# Patient Record
Sex: Male | Born: 1935 | Race: White | Hispanic: No | Smoking: Former smoker
Health system: Southern US, Community
[De-identification: ages and names within clinical notes are randomized; demographics above are authoritative.]

## PROBLEM LIST (undated history)

## (undated) DIAGNOSIS — I10 Essential (primary) hypertension: Secondary | ICD-10-CM

## (undated) DIAGNOSIS — F0390 Unspecified dementia without behavioral disturbance: Secondary | ICD-10-CM

## (undated) DIAGNOSIS — G8929 Other chronic pain: Secondary | ICD-10-CM

## (undated) DIAGNOSIS — D649 Anemia, unspecified: Secondary | ICD-10-CM

## (undated) DIAGNOSIS — F32A Depression, unspecified: Secondary | ICD-10-CM

## (undated) DIAGNOSIS — K219 Gastro-esophageal reflux disease without esophagitis: Secondary | ICD-10-CM

## (undated) DIAGNOSIS — J189 Pneumonia, unspecified organism: Secondary | ICD-10-CM

## (undated) DIAGNOSIS — F419 Anxiety disorder, unspecified: Secondary | ICD-10-CM

## (undated) DIAGNOSIS — F329 Major depressive disorder, single episode, unspecified: Secondary | ICD-10-CM

## (undated) DIAGNOSIS — N289 Disorder of kidney and ureter, unspecified: Secondary | ICD-10-CM

## (undated) DIAGNOSIS — J449 Chronic obstructive pulmonary disease, unspecified: Secondary | ICD-10-CM

## (undated) HISTORY — PX: HIP ARTHROPLASTY: SHX981

## (undated) HISTORY — PX: HERNIA REPAIR: SHX51

## (undated) HISTORY — PX: LUMBAR LAMINECTOMY: SHX95

## (undated) HISTORY — PX: CATARACT EXTRACTION: SUR2

---

## 2003-12-31 ENCOUNTER — Other Ambulatory Visit: Payer: Self-pay

## 2004-05-26 ENCOUNTER — Emergency Department: Payer: Self-pay | Admitting: Emergency Medicine

## 2004-07-08 ENCOUNTER — Other Ambulatory Visit: Payer: Self-pay

## 2004-07-08 ENCOUNTER — Ambulatory Visit: Payer: Self-pay | Admitting: Unknown Physician Specialty

## 2004-09-19 ENCOUNTER — Inpatient Hospital Stay: Payer: Self-pay | Admitting: General Practice

## 2004-09-23 ENCOUNTER — Emergency Department: Payer: Self-pay | Admitting: Emergency Medicine

## 2006-08-17 ENCOUNTER — Emergency Department: Payer: Self-pay | Admitting: Emergency Medicine

## 2007-08-17 ENCOUNTER — Emergency Department: Payer: Self-pay | Admitting: Emergency Medicine

## 2007-08-17 ENCOUNTER — Other Ambulatory Visit: Payer: Self-pay

## 2007-09-18 ENCOUNTER — Ambulatory Visit: Payer: Self-pay | Admitting: Gastroenterology

## 2007-09-24 ENCOUNTER — Ambulatory Visit: Payer: Self-pay | Admitting: General Practice

## 2007-09-25 ENCOUNTER — Ambulatory Visit: Payer: Self-pay | Admitting: Gastroenterology

## 2008-04-15 ENCOUNTER — Ambulatory Visit: Payer: Self-pay | Admitting: General Practice

## 2008-04-15 ENCOUNTER — Other Ambulatory Visit: Payer: Self-pay

## 2008-09-16 ENCOUNTER — Ambulatory Visit: Payer: Self-pay | Admitting: General Practice

## 2008-09-27 ENCOUNTER — Inpatient Hospital Stay: Payer: Self-pay | Admitting: General Practice

## 2011-08-27 ENCOUNTER — Emergency Department: Payer: Self-pay | Admitting: Emergency Medicine

## 2011-12-02 ENCOUNTER — Inpatient Hospital Stay: Payer: Self-pay | Admitting: Internal Medicine

## 2011-12-02 LAB — COMPREHENSIVE METABOLIC PANEL
Alkaline Phosphatase: 71 U/L (ref 50–136)
BUN: 27 mg/dL — ABNORMAL HIGH (ref 7–18)
Bilirubin,Total: 0.1 mg/dL — ABNORMAL LOW (ref 0.2–1.0)
Potassium: 4.1 mmol/L (ref 3.5–5.1)
SGOT(AST): 27 U/L (ref 15–37)
SGPT (ALT): 18 U/L

## 2011-12-02 LAB — URINALYSIS, COMPLETE
Bacteria: NONE SEEN
Blood: NEGATIVE
Glucose,UR: NEGATIVE mg/dL (ref 0–75)
Ketone: NEGATIVE
Ph: 5 (ref 4.5–8.0)
Protein: NEGATIVE
Squamous Epithelial: <1
WBC UR: <1 /HPF (ref 0–5)

## 2011-12-02 LAB — CBC
MCH: 30.8 pg (ref 26.0–34.0)
MCHC: 31.6 g/dL — ABNORMAL LOW (ref 32.0–36.0)
Platelet: 294 10*3/uL (ref 150–440)
RBC: 3.64 10*6/uL — ABNORMAL LOW (ref 4.40–5.90)
RDW: 15.3 % — ABNORMAL HIGH (ref 11.5–14.5)

## 2011-12-02 LAB — PRO B NATRIURETIC PEPTIDE: B-Type Natriuretic Peptide: 1056 pg/mL — ABNORMAL HIGH (ref 0–450)

## 2011-12-02 LAB — MAGNESIUM: Magnesium: 1.9 mg/dL

## 2011-12-02 LAB — TSH: Thyroid Stimulating Horm: 1.97 u[IU]/mL

## 2011-12-02 LAB — TROPONIN I: Troponin-I: <0.02 ng/mL

## 2011-12-02 LAB — CK TOTAL AND CKMB (NOT AT ARMC): CK-MB: 2 ng/mL (ref 0.5–3.6)

## 2011-12-02 LAB — PHOSPHORUS: Phosphorus: 4.6 mg/dL (ref 2.5–4.9)

## 2011-12-03 LAB — COMPREHENSIVE METABOLIC PANEL
Albumin: 3.1 g/dL — ABNORMAL LOW (ref 3.4–5.0)
Alkaline Phosphatase: 60 U/L (ref 50–136)
Anion Gap: 11 (ref 7–16)
BUN: 26 mg/dL — ABNORMAL HIGH (ref 7–18)
Bilirubin,Total: 0.1 mg/dL — ABNORMAL LOW (ref 0.2–1.0)
Calcium, Total: 8.3 mg/dL — ABNORMAL LOW (ref 8.5–10.1)
Chloride: 107 mmol/L (ref 98–107)
Glucose: 116 mg/dL — ABNORMAL HIGH (ref 65–99)
Osmolality: 287 (ref 275–301)
SGOT(AST): 17 U/L (ref 15–37)
SGPT (ALT): 15 U/L
Total Protein: 6 g/dL — ABNORMAL LOW (ref 6.4–8.2)

## 2011-12-03 LAB — CBC WITH DIFFERENTIAL/PLATELET
Basophil #: 0.1 10*3/uL (ref 0.0–0.1)
Basophil %: 1.1 %
Eosinophil #: 1.6 10*3/uL — ABNORMAL HIGH (ref 0.0–0.7)
Eosinophil %: 17.5 %
HGB: 10.1 g/dL — ABNORMAL LOW (ref 13.0–18.0)
MCH: 31 pg (ref 26.0–34.0)
MCV: 97 fL (ref 80–100)
Monocyte #: 1 x10 3/mm (ref 0.2–1.0)
Monocyte %: 10.1 %
Neutrophil #: 5.6 10*3/uL (ref 1.4–6.5)
Neutrophil %: 59.4 %
RBC: 3.26 10*6/uL — ABNORMAL LOW (ref 4.40–5.90)
RDW: 15.1 % — ABNORMAL HIGH (ref 11.5–14.5)
WBC: 9.4 10*3/uL (ref 3.8–10.6)

## 2011-12-03 LAB — TROPONIN I: Troponin-I: <0.02 ng/mL

## 2011-12-03 LAB — CK TOTAL AND CKMB (NOT AT ARMC): CK, Total: 64 U/L (ref 35–232)

## 2011-12-05 LAB — BASIC METABOLIC PANEL
Anion Gap: 9 (ref 7–16)
BUN: 23 mg/dL — ABNORMAL HIGH (ref 7–18)
Co2: 26 mmol/L (ref 21–32)
Creatinine: 1.61 mg/dL — ABNORMAL HIGH (ref 0.60–1.30)
Glucose: 110 mg/dL — ABNORMAL HIGH (ref 65–99)
Potassium: 4.1 mmol/L (ref 3.5–5.1)
Sodium: 139 mmol/L (ref 136–145)

## 2011-12-16 LAB — BASIC METABOLIC PANEL
Anion Gap: 9 (ref 7–16)
BUN: 29 mg/dL — ABNORMAL HIGH (ref 7–18)
Calcium, Total: 8.2 mg/dL — ABNORMAL LOW (ref 8.5–10.1)
Chloride: 103 mmol/L (ref 98–107)
Creatinine: 2.12 mg/dL — ABNORMAL HIGH (ref 0.60–1.30)
EGFR (Non-African Amer.): 27 — ABNORMAL LOW
Glucose: 121 mg/dL — ABNORMAL HIGH (ref 65–99)
Osmolality: 279 (ref 275–301)
Sodium: 136 mmol/L (ref 136–145)

## 2011-12-16 LAB — CBC WITH DIFFERENTIAL/PLATELET
Basophil #: 0.1 10*3/uL (ref 0.0–0.1)
Eosinophil #: 0 10*3/uL (ref 0.0–0.7)
Eosinophil %: 0.3 %
HCT: 29.9 % — ABNORMAL LOW (ref 40.0–52.0)
Lymphocyte #: 0.5 10*3/uL — ABNORMAL LOW (ref 1.0–3.6)
MCH: 30.9 pg (ref 26.0–34.0)
Monocyte #: 1.4 x10 3/mm — ABNORMAL HIGH (ref 0.2–1.0)
Monocyte %: 8.8 %
Neutrophil #: 13.9 10*3/uL — ABNORMAL HIGH (ref 1.4–6.5)
Neutrophil %: 87.2 %
Platelet: 254 10*3/uL (ref 150–440)
RBC: 3.13 10*6/uL — ABNORMAL LOW (ref 4.40–5.90)

## 2011-12-17 ENCOUNTER — Inpatient Hospital Stay: Payer: Self-pay | Admitting: Internal Medicine

## 2011-12-18 LAB — COMPREHENSIVE METABOLIC PANEL
Albumin: 2.5 g/dL — ABNORMAL LOW (ref 3.4–5.0)
Alkaline Phosphatase: 58 U/L (ref 50–136)
Bilirubin,Total: 0.2 mg/dL (ref 0.2–1.0)
SGOT(AST): 16 U/L (ref 15–37)
Total Protein: 5.5 g/dL — ABNORMAL LOW (ref 6.4–8.2)

## 2011-12-18 LAB — BASIC METABOLIC PANEL
Anion Gap: 9 (ref 7–16)
BUN: 19 mg/dL — ABNORMAL HIGH (ref 7–18)
Calcium, Total: 7.7 mg/dL — ABNORMAL LOW (ref 8.5–10.1)
Chloride: 106 mmol/L (ref 98–107)
Co2: 24 mmol/L (ref 21–32)
Creatinine: 1.81 mg/dL — ABNORMAL HIGH (ref 0.60–1.30)
EGFR (African American): 38 — ABNORMAL LOW

## 2011-12-18 LAB — CBC WITH DIFFERENTIAL/PLATELET
Eosinophil #: 0.3 10*3/uL (ref 0.0–0.7)
Eosinophil %: 3.3 %
HCT: 24.7 % — ABNORMAL LOW (ref 40.0–52.0)
HGB: 8.3 g/dL — ABNORMAL LOW (ref 13.0–18.0)
Lymphocyte %: 6.7 %
MCH: 31.7 pg (ref 26.0–34.0)
MCHC: 33.5 g/dL (ref 32.0–36.0)
MCV: 95 fL (ref 80–100)
Monocyte #: 0.9 x10 3/mm (ref 0.2–1.0)
Neutrophil #: 7.7 10*3/uL — ABNORMAL HIGH (ref 1.4–6.5)
Platelet: 205 10*3/uL (ref 150–440)
RBC: 2.62 10*6/uL — ABNORMAL LOW (ref 4.40–5.90)
WBC: 9.7 10*3/uL (ref 3.8–10.6)

## 2011-12-19 LAB — CBC WITH DIFFERENTIAL/PLATELET
Basophil %: 0.6 %
Eosinophil #: 0.9 10*3/uL — ABNORMAL HIGH (ref 0.0–0.7)
HCT: 27.6 % — ABNORMAL LOW (ref 40.0–52.0)
HGB: 9.1 g/dL — ABNORMAL LOW (ref 13.0–18.0)
MCH: 31.3 pg (ref 26.0–34.0)
Neutrophil #: 7.5 10*3/uL — ABNORMAL HIGH (ref 1.4–6.5)
Neutrophil %: 74.7 %

## 2011-12-19 LAB — COMPREHENSIVE METABOLIC PANEL
Alkaline Phosphatase: 58 U/L (ref 50–136)
Anion Gap: 8 (ref 7–16)
BUN: 19 mg/dL — ABNORMAL HIGH (ref 7–18)
Bilirubin,Total: 0.3 mg/dL (ref 0.2–1.0)
Calcium, Total: 8.2 mg/dL — ABNORMAL LOW (ref 8.5–10.1)
Co2: 26 mmol/L (ref 21–32)
EGFR (Non-African Amer.): 35 — ABNORMAL LOW
Osmolality: 282 (ref 275–301)
Potassium: 4.4 mmol/L (ref 3.5–5.1)
SGOT(AST): 17 U/L (ref 15–37)
Sodium: 140 mmol/L (ref 136–145)
Total Protein: 5.9 g/dL — ABNORMAL LOW (ref 6.4–8.2)

## 2011-12-22 LAB — CULTURE, BLOOD (SINGLE)

## 2012-01-03 LAB — COMPREHENSIVE METABOLIC PANEL
Albumin: 3.7 g/dL (ref 3.4–5.0)
Alkaline Phosphatase: 77 U/L (ref 50–136)
Anion Gap: 11 (ref 7–16)
BUN: 27 mg/dL — ABNORMAL HIGH (ref 7–18)
Calcium, Total: 8.8 mg/dL (ref 8.5–10.1)
Chloride: 107 mmol/L (ref 98–107)
Co2: 25 mmol/L (ref 21–32)
EGFR (Non-African Amer.): 23 — ABNORMAL LOW
Glucose: 106 mg/dL — ABNORMAL HIGH (ref 65–99)
Osmolality: 291 (ref 275–301)
Potassium: 4.9 mmol/L (ref 3.5–5.1)
SGOT(AST): 17 U/L (ref 15–37)
SGPT (ALT): 17 U/L
Sodium: 143 mmol/L (ref 136–145)
Total Protein: 7.1 g/dL (ref 6.4–8.2)

## 2012-01-03 LAB — CBC
HGB: 9.3 g/dL — ABNORMAL LOW (ref 13.0–18.0)
MCH: 32.2 pg (ref 26.0–34.0)
MCV: 96 fL (ref 80–100)
Platelet: 308 10*3/uL (ref 150–440)
RBC: 2.9 10*6/uL — ABNORMAL LOW (ref 4.40–5.90)
WBC: 8.5 10*3/uL (ref 3.8–10.6)

## 2012-01-03 LAB — CK TOTAL AND CKMB (NOT AT ARMC)
CK, Total: 104 U/L (ref 35–232)
CK-MB: 2 ng/mL (ref 0.5–3.6)

## 2012-01-03 LAB — PRO B NATRIURETIC PEPTIDE: B-Type Natriuretic Peptide: 608 pg/mL — ABNORMAL HIGH (ref 0–450)

## 2012-01-04 ENCOUNTER — Observation Stay: Payer: Self-pay | Admitting: Internal Medicine

## 2012-01-04 LAB — MAGNESIUM: Magnesium: 2 mg/dL

## 2012-03-19 ENCOUNTER — Ambulatory Visit: Payer: Self-pay | Admitting: Internal Medicine

## 2012-11-30 ENCOUNTER — Emergency Department: Payer: Self-pay | Admitting: Emergency Medicine

## 2013-10-04 ENCOUNTER — Ambulatory Visit: Payer: Self-pay | Admitting: Internal Medicine

## 2013-10-08 LAB — CBC CANCER CENTER
Bands: 1 %
EOS PCT: 12 %
HCT: 26.2 % — AB (ref 40.0–52.0)
HGB: 8.4 g/dL — ABNORMAL LOW (ref 13.0–18.0)
LYMPHS PCT: 4 %
MCH: 33 pg (ref 26.0–34.0)
MCHC: 32.2 g/dL (ref 32.0–36.0)
MCV: 103 fL — AB (ref 80–100)
MONOS PCT: 6 %
PLATELETS: 336 x10 3/mm (ref 150–440)
RBC: 2.55 10*6/uL — AB (ref 4.40–5.90)
RDW: 15 % — ABNORMAL HIGH (ref 11.5–14.5)
SEGMENTED NEUTROPHILS: 77 %
WBC: 10.4 x10 3/mm (ref 3.8–10.6)

## 2013-10-08 LAB — FERRITIN: Ferritin (ARMC): 63 ng/mL (ref 8–388)

## 2013-10-08 LAB — LACTATE DEHYDROGENASE: LDH: 190 U/L (ref 85–241)

## 2013-10-08 LAB — RETICULOCYTES
Absolute Retic Count: 0.0945 10*6/uL (ref 0.019–0.186)
RETICULOCYTE: 3.7 % — AB (ref 0.4–3.1)

## 2013-10-08 LAB — IRON AND TIBC
IRON BIND. CAP.(TOTAL): 364 ug/dL (ref 250–450)
IRON: 22 ug/dL — AB (ref 65–175)
Iron Saturation: 6 %
Unbound Iron-Bind.Cap.: 342 ug/dL

## 2013-10-08 LAB — FOLATE: Folic Acid: 48.6 ng/mL (ref 3.1–100.0)

## 2013-10-10 ENCOUNTER — Observation Stay: Payer: Self-pay | Admitting: Internal Medicine

## 2013-10-10 LAB — COMPREHENSIVE METABOLIC PANEL
ALBUMIN: 3.4 g/dL (ref 3.4–5.0)
AST: 23 U/L (ref 15–37)
Alkaline Phosphatase: 79 U/L
Anion Gap: 6 — ABNORMAL LOW (ref 7–16)
BUN: 32 mg/dL — ABNORMAL HIGH (ref 7–18)
Bilirubin,Total: 0.2 mg/dL (ref 0.2–1.0)
CHLORIDE: 106 mmol/L (ref 98–107)
CO2: 27 mmol/L (ref 21–32)
Calcium, Total: 8.5 mg/dL (ref 8.5–10.1)
Creatinine: 2.11 mg/dL — ABNORMAL HIGH (ref 0.60–1.30)
EGFR (African American): 31 — ABNORMAL LOW
GFR CALC NON AF AMER: 27 — AB
GLUCOSE: 103 mg/dL — AB (ref 65–99)
Osmolality: 285 (ref 275–301)
Potassium: 3.7 mmol/L (ref 3.5–5.1)
SGPT (ALT): 21 U/L (ref 12–78)
Sodium: 139 mmol/L (ref 136–145)
TOTAL PROTEIN: 6.6 g/dL (ref 6.4–8.2)

## 2013-10-10 LAB — CBC WITH DIFFERENTIAL/PLATELET
BASOS ABS: 0.1 10*3/uL (ref 0.0–0.1)
Basophil %: 1.1 %
Eosinophil #: 1.3 10*3/uL — ABNORMAL HIGH (ref 0.0–0.7)
Eosinophil %: 11.2 %
HCT: 26.7 % — ABNORMAL LOW (ref 40.0–52.0)
HGB: 8.8 g/dL — ABNORMAL LOW (ref 13.0–18.0)
LYMPHS ABS: 0.6 10*3/uL — AB (ref 1.0–3.6)
Lymphocyte %: 5.2 %
MCH: 34 pg (ref 26.0–34.0)
MCHC: 33 g/dL (ref 32.0–36.0)
MCV: 103 fL — AB (ref 80–100)
MONOS PCT: 9.3 %
Monocyte #: 1.1 x10 3/mm — ABNORMAL HIGH (ref 0.2–1.0)
NEUTROS ABS: 8.5 10*3/uL — AB (ref 1.4–6.5)
NEUTROS PCT: 73.2 %
Platelet: 317 10*3/uL (ref 150–440)
RBC: 2.59 10*6/uL — AB (ref 4.40–5.90)
RDW: 15 % — ABNORMAL HIGH (ref 11.5–14.5)
WBC: 11.6 10*3/uL — AB (ref 3.8–10.6)

## 2013-10-10 LAB — PROTIME-INR
INR: 1
Prothrombin Time: 12.7 secs (ref 11.5–14.7)

## 2013-10-10 LAB — MAGNESIUM: MAGNESIUM: 2.1 mg/dL

## 2013-10-10 LAB — URINALYSIS, COMPLETE
BACTERIA: NONE SEEN
Bilirubin,UR: NEGATIVE
Blood: NEGATIVE
GLUCOSE, UR: NEGATIVE mg/dL (ref 0–75)
KETONE: NEGATIVE
Leukocyte Esterase: NEGATIVE
Nitrite: NEGATIVE
Ph: 6 (ref 4.5–8.0)
RBC, UR: NONE SEEN /HPF (ref 0–5)
SQUAMOUS EPITHELIAL: NONE SEEN
Specific Gravity: 1.009 (ref 1.003–1.030)

## 2013-10-10 LAB — APTT: Activated PTT: 31.6 secs (ref 23.6–35.9)

## 2013-10-10 LAB — LIPASE, BLOOD: Lipase: 201 U/L (ref 73–393)

## 2013-10-10 LAB — SEDIMENTATION RATE: Erythrocyte Sed Rate: 32 mm/hr — ABNORMAL HIGH (ref 0–20)

## 2013-10-10 LAB — TSH: Thyroid Stimulating Horm: 2.17 u[IU]/mL

## 2013-10-10 LAB — GAMMA GT: GGT: 23 U/L (ref 5–85)

## 2013-10-10 LAB — TROPONIN I: Troponin-I: <0.02 ng/mL

## 2013-10-11 LAB — CBC WITH DIFFERENTIAL/PLATELET
BASOS ABS: 0.1 10*3/uL (ref 0.0–0.1)
BASOS PCT: 1.3 %
Eosinophil #: 1.4 10*3/uL — ABNORMAL HIGH (ref 0.0–0.7)
Eosinophil %: 12.3 %
HCT: 29.3 % — ABNORMAL LOW (ref 40.0–52.0)
HGB: 9.5 g/dL — ABNORMAL LOW (ref 13.0–18.0)
LYMPHS ABS: 0.6 10*3/uL — AB (ref 1.0–3.6)
LYMPHS PCT: 5.6 %
MCH: 33.1 pg (ref 26.0–34.0)
MCHC: 32.2 g/dL (ref 32.0–36.0)
MCV: 103 fL — ABNORMAL HIGH (ref 80–100)
MONO ABS: 0.9 x10 3/mm (ref 0.2–1.0)
Monocyte %: 8.4 %
Neutrophil #: 8 10*3/uL — ABNORMAL HIGH (ref 1.4–6.5)
Neutrophil %: 72.4 %
PLATELETS: 327 10*3/uL (ref 150–440)
RBC: 2.86 10*6/uL — ABNORMAL LOW (ref 4.40–5.90)
RDW: 14.8 % — ABNORMAL HIGH (ref 11.5–14.5)
WBC: 11.1 10*3/uL — ABNORMAL HIGH (ref 3.8–10.6)

## 2013-10-11 LAB — BASIC METABOLIC PANEL
Anion Gap: 5 — ABNORMAL LOW (ref 7–16)
BUN: 24 mg/dL — ABNORMAL HIGH (ref 7–18)
CREATININE: 1.7 mg/dL — AB (ref 0.60–1.30)
Calcium, Total: 8.7 mg/dL (ref 8.5–10.1)
Chloride: 105 mmol/L (ref 98–107)
Co2: 29 mmol/L (ref 21–32)
EGFR (African American): 40 — ABNORMAL LOW
EGFR (Non-African Amer.): 35 — ABNORMAL LOW
Glucose: 94 mg/dL (ref 65–99)
Osmolality: 281 (ref 275–301)
Potassium: 3.8 mmol/L (ref 3.5–5.1)
Sodium: 139 mmol/L (ref 136–145)

## 2013-10-12 LAB — COMPREHENSIVE METABOLIC PANEL
ALT: 21 U/L (ref 12–78)
AST: 24 U/L (ref 15–37)
Albumin: 3 g/dL — ABNORMAL LOW (ref 3.4–5.0)
Alkaline Phosphatase: 57 U/L
Anion Gap: 6 — ABNORMAL LOW (ref 7–16)
BUN: 19 mg/dL — ABNORMAL HIGH (ref 7–18)
Bilirubin,Total: 0.2 mg/dL (ref 0.2–1.0)
CALCIUM: 8.2 mg/dL — AB (ref 8.5–10.1)
Chloride: 106 mmol/L (ref 98–107)
Co2: 30 mmol/L (ref 21–32)
Creatinine: 1.67 mg/dL — ABNORMAL HIGH (ref 0.60–1.30)
EGFR (African American): 41 — ABNORMAL LOW
EGFR (Non-African Amer.): 35 — ABNORMAL LOW
GLUCOSE: 96 mg/dL (ref 65–99)
Osmolality: 285 (ref 275–301)
Potassium: 3.6 mmol/L (ref 3.5–5.1)
SODIUM: 142 mmol/L (ref 136–145)
Total Protein: 6 g/dL — ABNORMAL LOW (ref 6.4–8.2)

## 2013-10-12 LAB — URINE CULTURE

## 2013-10-12 LAB — CBC WITH DIFFERENTIAL/PLATELET
BASOS ABS: 0.1 10*3/uL (ref 0.0–0.1)
Basophil %: 0.8 %
Eosinophil #: 1.1 10*3/uL — ABNORMAL HIGH (ref 0.0–0.7)
Eosinophil %: 9.1 %
HCT: 28.5 % — ABNORMAL LOW (ref 40.0–52.0)
HGB: 9.4 g/dL — AB (ref 13.0–18.0)
LYMPHS PCT: 5.2 %
Lymphocyte #: 0.6 10*3/uL — ABNORMAL LOW (ref 1.0–3.6)
MCH: 33.7 pg (ref 26.0–34.0)
MCHC: 33.1 g/dL (ref 32.0–36.0)
MCV: 102 fL — ABNORMAL HIGH (ref 80–100)
MONO ABS: 1.2 x10 3/mm — AB (ref 0.2–1.0)
MONOS PCT: 9.9 %
Neutrophil #: 8.9 10*3/uL — ABNORMAL HIGH (ref 1.4–6.5)
Neutrophil %: 75 %
PLATELETS: 315 10*3/uL (ref 150–440)
RBC: 2.8 10*6/uL — ABNORMAL LOW (ref 4.40–5.90)
RDW: 14.5 % (ref 11.5–14.5)
WBC: 11.8 10*3/uL — ABNORMAL HIGH (ref 3.8–10.6)

## 2013-10-15 LAB — CULTURE, BLOOD (SINGLE)

## 2013-10-17 ENCOUNTER — Ambulatory Visit: Payer: Self-pay | Admitting: Internal Medicine

## 2013-11-05 ENCOUNTER — Emergency Department: Payer: Self-pay | Admitting: Emergency Medicine

## 2013-11-05 LAB — DRUG SCREEN, URINE
Amphetamines, Ur Screen: NEGATIVE (ref ?–1000)
BARBITURATES, UR SCREEN: NEGATIVE (ref ?–200)
BENZODIAZEPINE, UR SCRN: NEGATIVE (ref ?–200)
Cannabinoid 50 Ng, Ur ~~LOC~~: NEGATIVE (ref ?–50)
Cocaine Metabolite,Ur ~~LOC~~: NEGATIVE (ref ?–300)
MDMA (ECSTASY) UR SCREEN: NEGATIVE (ref ?–500)
Methadone, Ur Screen: NEGATIVE (ref ?–300)
Opiate, Ur Screen: NEGATIVE (ref ?–300)
Phencyclidine (PCP) Ur S: NEGATIVE (ref ?–25)
Tricyclic, Ur Screen: NEGATIVE (ref ?–1000)

## 2013-11-05 LAB — CBC
HCT: 26.6 % — ABNORMAL LOW (ref 40.0–52.0)
HGB: 8.8 g/dL — AB (ref 13.0–18.0)
MCH: 30.7 pg (ref 26.0–34.0)
MCHC: 33.1 g/dL (ref 32.0–36.0)
MCV: 93 fL (ref 80–100)
PLATELETS: 353 10*3/uL (ref 150–440)
RBC: 2.87 10*6/uL — ABNORMAL LOW (ref 4.40–5.90)
RDW: 16.6 % — AB (ref 11.5–14.5)
WBC: 10.4 10*3/uL (ref 3.8–10.6)

## 2013-11-05 LAB — URINALYSIS, COMPLETE
BILIRUBIN, UR: NEGATIVE
Blood: NEGATIVE
GLUCOSE, UR: NEGATIVE mg/dL (ref 0–75)
Ketone: NEGATIVE
Leukocyte Esterase: NEGATIVE
Nitrite: NEGATIVE
PH: 6 (ref 4.5–8.0)
Protein: 30
RBC,UR: <1 /HPF (ref 0–5)
SPECIFIC GRAVITY: 1.008 (ref 1.003–1.030)
Squamous Epithelial: <1

## 2013-11-05 LAB — COMPREHENSIVE METABOLIC PANEL
ALT: 16 U/L (ref 12–78)
ANION GAP: 5 — AB (ref 7–16)
Albumin: 3.2 g/dL — ABNORMAL LOW (ref 3.4–5.0)
Alkaline Phosphatase: 92 U/L
BUN: 25 mg/dL — ABNORMAL HIGH (ref 7–18)
Bilirubin,Total: 0.1 mg/dL — ABNORMAL LOW (ref 0.2–1.0)
CHLORIDE: 105 mmol/L (ref 98–107)
CO2: 29 mmol/L (ref 21–32)
Calcium, Total: 8.1 mg/dL — ABNORMAL LOW (ref 8.5–10.1)
Creatinine: 2.34 mg/dL — ABNORMAL HIGH (ref 0.60–1.30)
EGFR (Non-African Amer.): 24 — ABNORMAL LOW
GFR CALC AF AMER: 27 — AB
Glucose: 115 mg/dL — ABNORMAL HIGH (ref 65–99)
OSMOLALITY: 283 (ref 275–301)
Potassium: 3.6 mmol/L (ref 3.5–5.1)
SGOT(AST): 19 U/L (ref 15–37)
SODIUM: 139 mmol/L (ref 136–145)
TOTAL PROTEIN: 6.6 g/dL (ref 6.4–8.2)

## 2013-11-05 LAB — ETHANOL

## 2013-11-05 LAB — TROPONIN I: Troponin-I: <0.02 ng/mL

## 2013-11-05 LAB — ACETAMINOPHEN LEVEL: Acetaminophen: <2 ug/mL

## 2013-11-05 LAB — SALICYLATE LEVEL: Salicylates, Serum: <1.7 mg/dL

## 2013-11-09 LAB — CBC
HCT: 30.8 % — ABNORMAL LOW (ref 40.0–52.0)
HGB: 9.9 g/dL — ABNORMAL LOW (ref 13.0–18.0)
MCH: 30 pg (ref 26.0–34.0)
MCHC: 32 g/dL (ref 32.0–36.0)
MCV: 94 fL (ref 80–100)
Platelet: 345 10*3/uL (ref 150–440)
RBC: 3.28 10*6/uL — ABNORMAL LOW (ref 4.40–5.90)
RDW: 17.4 % — ABNORMAL HIGH (ref 11.5–14.5)
WBC: 18.1 10*3/uL — AB (ref 3.8–10.6)

## 2013-11-09 LAB — BASIC METABOLIC PANEL
ANION GAP: 5 — AB (ref 7–16)
BUN: 31 mg/dL — ABNORMAL HIGH (ref 7–18)
Calcium, Total: 8.7 mg/dL (ref 8.5–10.1)
Chloride: 99 mmol/L (ref 98–107)
Co2: 31 mmol/L (ref 21–32)
Creatinine: 1.86 mg/dL — ABNORMAL HIGH (ref 0.60–1.30)
EGFR (African American): 36 — ABNORMAL LOW
EGFR (Non-African Amer.): 31 — ABNORMAL LOW
GLUCOSE: 128 mg/dL — AB (ref 65–99)
Osmolality: 278 (ref 275–301)
Potassium: 3.9 mmol/L (ref 3.5–5.1)
SODIUM: 135 mmol/L — AB (ref 136–145)

## 2013-11-18 ENCOUNTER — Ambulatory Visit: Payer: Self-pay | Admitting: Internal Medicine

## 2013-11-19 LAB — IRON AND TIBC
IRON BIND. CAP.(TOTAL): 339 ug/dL (ref 250–450)
Iron Saturation: 71 %
Iron: 242 ug/dL — ABNORMAL HIGH (ref 65–175)
Unbound Iron-Bind.Cap.: 97 ug/dL

## 2013-11-19 LAB — CANCER CENTER HEMOGLOBIN: HGB: 7.7 g/dL — ABNORMAL LOW (ref 13.0–18.0)

## 2013-11-19 LAB — FERRITIN: FERRITIN (ARMC): 43 ng/mL (ref 8–388)

## 2013-11-24 ENCOUNTER — Inpatient Hospital Stay: Payer: Self-pay

## 2013-11-24 LAB — PRO B NATRIURETIC PEPTIDE: B-Type Natriuretic Peptide: 1918 pg/mL — ABNORMAL HIGH (ref 0–450)

## 2013-11-24 LAB — COMPREHENSIVE METABOLIC PANEL
ALBUMIN: 2.9 g/dL — AB (ref 3.4–5.0)
Alkaline Phosphatase: 92 U/L
Anion Gap: 3 — ABNORMAL LOW (ref 7–16)
BUN: 29 mg/dL — ABNORMAL HIGH (ref 7–18)
Bilirubin,Total: 0.1 mg/dL — ABNORMAL LOW (ref 0.2–1.0)
CALCIUM: 7.8 mg/dL — AB (ref 8.5–10.1)
Chloride: 102 mmol/L (ref 98–107)
Co2: 30 mmol/L (ref 21–32)
Creatinine: 2.18 mg/dL — ABNORMAL HIGH (ref 0.60–1.30)
EGFR (African American): 30 — ABNORMAL LOW
EGFR (Non-African Amer.): 26 — ABNORMAL LOW
Glucose: 115 mg/dL — ABNORMAL HIGH (ref 65–99)
Osmolality: 277 (ref 275–301)
POTASSIUM: 4 mmol/L (ref 3.5–5.1)
SGOT(AST): 36 U/L (ref 15–37)
SGPT (ALT): 21 U/L (ref 12–78)
Sodium: 135 mmol/L — ABNORMAL LOW (ref 136–145)
TOTAL PROTEIN: 6.3 g/dL — AB (ref 6.4–8.2)

## 2013-11-24 LAB — CBC
HCT: 24.7 % — ABNORMAL LOW (ref 40.0–52.0)
HGB: 7.5 g/dL — ABNORMAL LOW (ref 13.0–18.0)
MCH: 28.9 pg (ref 26.0–34.0)
MCHC: 30.6 g/dL — ABNORMAL LOW (ref 32.0–36.0)
MCV: 95 fL (ref 80–100)
PLATELETS: 303 10*3/uL (ref 150–440)
RBC: 2.61 10*6/uL — ABNORMAL LOW (ref 4.40–5.90)
RDW: 19.5 % — ABNORMAL HIGH (ref 11.5–14.5)
WBC: 8.4 10*3/uL (ref 3.8–10.6)

## 2013-11-24 LAB — TROPONIN I

## 2013-11-26 LAB — BASIC METABOLIC PANEL
Anion Gap: 6 — ABNORMAL LOW (ref 7–16)
BUN: 30 mg/dL — ABNORMAL HIGH (ref 7–18)
CALCIUM: 8.6 mg/dL (ref 8.5–10.1)
CREATININE: 1.88 mg/dL — AB (ref 0.60–1.30)
Chloride: 102 mmol/L (ref 98–107)
Co2: 29 mmol/L (ref 21–32)
EGFR (Non-African Amer.): 31 — ABNORMAL LOW
GFR CALC AF AMER: 36 — AB
Glucose: 107 mg/dL — ABNORMAL HIGH (ref 65–99)
OSMOLALITY: 280 (ref 275–301)
Potassium: 3.8 mmol/L (ref 3.5–5.1)
Sodium: 137 mmol/L (ref 136–145)

## 2013-11-26 LAB — CBC WITH DIFFERENTIAL/PLATELET
BASOS ABS: 0 10*3/uL (ref 0.0–0.1)
BASOS PCT: 0.2 %
EOS ABS: 0 10*3/uL (ref 0.0–0.7)
Eosinophil %: 0.1 %
HCT: 26.6 % — ABNORMAL LOW (ref 40.0–52.0)
HGB: 8.4 g/dL — ABNORMAL LOW (ref 13.0–18.0)
LYMPHS ABS: 0.7 10*3/uL — AB (ref 1.0–3.6)
Lymphocyte %: 6.3 %
MCH: 29.9 pg (ref 26.0–34.0)
MCHC: 31.7 g/dL — AB (ref 32.0–36.0)
MCV: 94 fL (ref 80–100)
MONO ABS: 1.1 x10 3/mm — AB (ref 0.2–1.0)
Monocyte %: 9.5 %
Neutrophil #: 9.7 10*3/uL — ABNORMAL HIGH (ref 1.4–6.5)
Neutrophil %: 83.9 %
PLATELETS: 291 10*3/uL (ref 150–440)
RBC: 2.82 10*6/uL — ABNORMAL LOW (ref 4.40–5.90)
RDW: 19.4 % — ABNORMAL HIGH (ref 11.5–14.5)
WBC: 11.5 10*3/uL — ABNORMAL HIGH (ref 3.8–10.6)

## 2013-11-29 LAB — CULTURE, BLOOD (SINGLE)

## 2013-11-30 ENCOUNTER — Ambulatory Visit: Payer: Self-pay | Admitting: Internal Medicine

## 2013-12-13 LAB — CANCER CENTER HEMOGLOBIN: HGB: 9.5 g/dL — AB (ref 13.0–18.0)

## 2013-12-17 ENCOUNTER — Ambulatory Visit: Payer: Self-pay | Admitting: Internal Medicine

## 2013-12-20 LAB — CANCER CENTER HEMOGLOBIN: HGB: 10.2 g/dL — AB (ref 13.0–18.0)

## 2013-12-23 ENCOUNTER — Emergency Department: Payer: Self-pay | Admitting: Emergency Medicine

## 2013-12-23 LAB — CBC
HCT: 32.7 % — ABNORMAL LOW (ref 40.0–52.0)
HGB: 10.3 g/dL — AB (ref 13.0–18.0)
MCH: 30.5 pg (ref 26.0–34.0)
MCHC: 31.6 g/dL — AB (ref 32.0–36.0)
MCV: 96 fL (ref 80–100)
PLATELETS: 315 10*3/uL (ref 150–440)
RBC: 3.39 10*6/uL — AB (ref 4.40–5.90)
RDW: 19 % — ABNORMAL HIGH (ref 11.5–14.5)
WBC: 8 10*3/uL (ref 3.8–10.6)

## 2013-12-24 LAB — BASIC METABOLIC PANEL
Anion Gap: 4 — ABNORMAL LOW (ref 7–16)
BUN: 21 mg/dL — AB (ref 7–18)
CALCIUM: 8.5 mg/dL (ref 8.5–10.1)
CHLORIDE: 107 mmol/L (ref 98–107)
Co2: 29 mmol/L (ref 21–32)
Creatinine: 1.71 mg/dL — ABNORMAL HIGH (ref 0.60–1.30)
GFR CALC AF AMER: 40 — AB
GFR CALC NON AF AMER: 34 — AB
Glucose: 108 mg/dL — ABNORMAL HIGH (ref 65–99)
Osmolality: 283 (ref 275–301)
POTASSIUM: 4 mmol/L (ref 3.5–5.1)
Sodium: 140 mmol/L (ref 136–145)

## 2014-01-03 ENCOUNTER — Emergency Department: Payer: Self-pay | Admitting: Emergency Medicine

## 2014-01-03 LAB — CANCER CENTER HEMOGLOBIN: HGB: 11.3 g/dL — AB (ref 13.0–18.0)

## 2014-01-03 LAB — COMPREHENSIVE METABOLIC PANEL
ALBUMIN: 3.5 g/dL (ref 3.4–5.0)
Alkaline Phosphatase: 85 U/L
Anion Gap: 5 — ABNORMAL LOW (ref 7–16)
BUN: 24 mg/dL — ABNORMAL HIGH (ref 7–18)
Bilirubin,Total: 0.3 mg/dL (ref 0.2–1.0)
Calcium, Total: 9.1 mg/dL (ref 8.5–10.1)
Chloride: 103 mmol/L (ref 98–107)
Co2: 29 mmol/L (ref 21–32)
Creatinine: 1.68 mg/dL — ABNORMAL HIGH (ref 0.60–1.30)
EGFR (Non-African Amer.): 35 — ABNORMAL LOW
GFR CALC AF AMER: 41 — AB
Glucose: 106 mg/dL — ABNORMAL HIGH (ref 65–99)
Osmolality: 278 (ref 275–301)
Potassium: 3.8 mmol/L (ref 3.5–5.1)
SGOT(AST): 23 U/L (ref 15–37)
SGPT (ALT): 16 U/L (ref 12–78)
Sodium: 137 mmol/L (ref 136–145)
Total Protein: 6.9 g/dL (ref 6.4–8.2)

## 2014-01-03 LAB — CBC
HCT: 36.6 % — AB (ref 40.0–52.0)
HGB: 11.8 g/dL — AB (ref 13.0–18.0)
MCH: 30.4 pg (ref 26.0–34.0)
MCHC: 32.1 g/dL (ref 32.0–36.0)
MCV: 95 fL (ref 80–100)
PLATELETS: 285 10*3/uL (ref 150–440)
RBC: 3.86 10*6/uL — ABNORMAL LOW (ref 4.40–5.90)
RDW: 17.9 % — AB (ref 11.5–14.5)
WBC: 10.7 10*3/uL — ABNORMAL HIGH (ref 3.8–10.6)

## 2014-01-03 LAB — TROPONIN I: Troponin-I: <0.02 ng/mL

## 2014-01-03 LAB — CK: CK, TOTAL: 427 U/L — AB

## 2014-01-07 ENCOUNTER — Emergency Department: Payer: Self-pay | Admitting: Emergency Medicine

## 2014-01-07 LAB — URINALYSIS, COMPLETE
Bacteria: NONE SEEN
Bilirubin,UR: NEGATIVE
Blood: NEGATIVE
Glucose,UR: NEGATIVE mg/dL (ref 0–75)
Granular Cast: 7
Hyaline Cast: 2
KETONE: NEGATIVE
Leukocyte Esterase: NEGATIVE
NITRITE: NEGATIVE
Ph: 5 (ref 4.5–8.0)
Protein: 100
Specific Gravity: 1.012 (ref 1.003–1.030)
Squamous Epithelial: NONE SEEN
WBC UR: 1 /HPF (ref 0–5)

## 2014-01-09 ENCOUNTER — Emergency Department: Payer: Self-pay | Admitting: Emergency Medicine

## 2014-01-09 LAB — COMPREHENSIVE METABOLIC PANEL
ALBUMIN: 3.3 g/dL — AB (ref 3.4–5.0)
ANION GAP: 9 (ref 7–16)
AST: 15 U/L (ref 15–37)
Alkaline Phosphatase: 79 U/L
BILIRUBIN TOTAL: 0.3 mg/dL (ref 0.2–1.0)
BUN: 20 mg/dL — ABNORMAL HIGH (ref 7–18)
CREATININE: 1.37 mg/dL — AB (ref 0.60–1.30)
Calcium, Total: 8.9 mg/dL (ref 8.5–10.1)
Chloride: 101 mmol/L (ref 98–107)
Co2: 27 mmol/L (ref 21–32)
EGFR (African American): 52 — ABNORMAL LOW
EGFR (Non-African Amer.): 45 — ABNORMAL LOW
GLUCOSE: 121 mg/dL — AB (ref 65–99)
OSMOLALITY: 278 (ref 275–301)
POTASSIUM: 4.1 mmol/L (ref 3.5–5.1)
SGPT (ALT): 14 U/L (ref 12–78)
SODIUM: 137 mmol/L (ref 136–145)
TOTAL PROTEIN: 6.9 g/dL (ref 6.4–8.2)

## 2014-01-09 LAB — CBC
HCT: 36.2 % — ABNORMAL LOW (ref 40.0–52.0)
HGB: 11.6 g/dL — AB (ref 13.0–18.0)
MCH: 30.2 pg (ref 26.0–34.0)
MCHC: 32 g/dL (ref 32.0–36.0)
MCV: 94 fL (ref 80–100)
PLATELETS: 300 10*3/uL (ref 150–440)
RBC: 3.83 10*6/uL — AB (ref 4.40–5.90)
RDW: 17.2 % — AB (ref 11.5–14.5)
WBC: 13.2 10*3/uL — AB (ref 3.8–10.6)

## 2014-01-10 ENCOUNTER — Inpatient Hospital Stay: Payer: Self-pay | Admitting: Internal Medicine

## 2014-01-10 LAB — URINALYSIS, COMPLETE
BLOOD: NEGATIVE
Bacteria: NONE SEEN
Bilirubin,UR: NEGATIVE
GLUCOSE, UR: NEGATIVE mg/dL (ref 0–75)
Ketone: NEGATIVE
LEUKOCYTE ESTERASE: NEGATIVE
NITRITE: NEGATIVE
Ph: 5 (ref 4.5–8.0)
RBC,UR: NONE SEEN /HPF (ref 0–5)
Specific Gravity: 1.017 (ref 1.003–1.030)
Squamous Epithelial: <1

## 2014-01-10 LAB — CBC
HCT: 34.3 % — ABNORMAL LOW (ref 40.0–52.0)
HGB: 11.1 g/dL — AB (ref 13.0–18.0)
MCH: 30.2 pg (ref 26.0–34.0)
MCHC: 32.3 g/dL (ref 32.0–36.0)
MCV: 94 fL (ref 80–100)
Platelet: 342 10*3/uL (ref 150–440)
RBC: 3.67 10*6/uL — ABNORMAL LOW (ref 4.40–5.90)
RDW: 17.2 % — AB (ref 11.5–14.5)
WBC: 16.1 10*3/uL — ABNORMAL HIGH (ref 3.8–10.6)

## 2014-01-10 LAB — COMPREHENSIVE METABOLIC PANEL
Albumin: 3 g/dL — ABNORMAL LOW (ref 3.4–5.0)
Alkaline Phosphatase: 72 U/L
Anion Gap: 8 (ref 7–16)
BUN: 24 mg/dL — AB (ref 7–18)
Bilirubin,Total: 0.3 mg/dL (ref 0.2–1.0)
CALCIUM: 8.7 mg/dL (ref 8.5–10.1)
CHLORIDE: 100 mmol/L (ref 98–107)
Co2: 26 mmol/L (ref 21–32)
Creatinine: 1.85 mg/dL — ABNORMAL HIGH (ref 0.60–1.30)
EGFR (African American): 36 — ABNORMAL LOW
EGFR (Non-African Amer.): 31 — ABNORMAL LOW
Glucose: 128 mg/dL — ABNORMAL HIGH (ref 65–99)
Osmolality: 274 (ref 275–301)
Potassium: 4.3 mmol/L (ref 3.5–5.1)
SGOT(AST): 18 U/L (ref 15–37)
SGPT (ALT): 13 U/L (ref 12–78)
Sodium: 134 mmol/L — ABNORMAL LOW (ref 136–145)
TOTAL PROTEIN: 6.9 g/dL (ref 6.4–8.2)

## 2014-01-10 LAB — TROPONIN I

## 2014-01-11 LAB — BASIC METABOLIC PANEL
Anion Gap: 6 — ABNORMAL LOW (ref 7–16)
BUN: 22 mg/dL — ABNORMAL HIGH (ref 7–18)
CO2: 26 mmol/L (ref 21–32)
Calcium, Total: 8.4 mg/dL — ABNORMAL LOW (ref 8.5–10.1)
Chloride: 105 mmol/L (ref 98–107)
Creatinine: 1.61 mg/dL — ABNORMAL HIGH (ref 0.60–1.30)
EGFR (African American): 43 — ABNORMAL LOW
EGFR (Non-African Amer.): 37 — ABNORMAL LOW
GLUCOSE: 95 mg/dL (ref 65–99)
Osmolality: 277 (ref 275–301)
Potassium: 3.8 mmol/L (ref 3.5–5.1)
Sodium: 137 mmol/L (ref 136–145)

## 2014-01-11 LAB — CBC WITH DIFFERENTIAL/PLATELET
BASOS ABS: 0.1 10*3/uL (ref 0.0–0.1)
Basophil %: 0.8 %
EOS ABS: 0.1 10*3/uL (ref 0.0–0.7)
EOS PCT: 1 %
HCT: 30.7 % — AB (ref 40.0–52.0)
HGB: 10.1 g/dL — ABNORMAL LOW (ref 13.0–18.0)
Lymphocyte #: 0.8 10*3/uL — ABNORMAL LOW (ref 1.0–3.6)
Lymphocyte %: 6.9 %
MCH: 30.7 pg (ref 26.0–34.0)
MCHC: 32.8 g/dL (ref 32.0–36.0)
MCV: 94 fL (ref 80–100)
MONOS PCT: 14.3 %
Monocyte #: 1.6 x10 3/mm — ABNORMAL HIGH (ref 0.2–1.0)
NEUTROS ABS: 8.4 10*3/uL — AB (ref 1.4–6.5)
NEUTROS PCT: 77 %
Platelet: 295 10*3/uL (ref 150–440)
RBC: 3.28 10*6/uL — ABNORMAL LOW (ref 4.40–5.90)
RDW: 17.2 % — ABNORMAL HIGH (ref 11.5–14.5)
WBC: 10.9 10*3/uL — ABNORMAL HIGH (ref 3.8–10.6)

## 2014-01-13 LAB — BASIC METABOLIC PANEL
ANION GAP: 6 — AB (ref 7–16)
BUN: 17 mg/dL (ref 7–18)
CREATININE: 1.44 mg/dL — AB (ref 0.60–1.30)
Calcium, Total: 8.5 mg/dL (ref 8.5–10.1)
Chloride: 102 mmol/L (ref 98–107)
Co2: 27 mmol/L (ref 21–32)
EGFR (African American): 49 — ABNORMAL LOW
EGFR (Non-African Amer.): 42 — ABNORMAL LOW
Glucose: 92 mg/dL (ref 65–99)
Osmolality: 271 (ref 275–301)
Potassium: 3.5 mmol/L (ref 3.5–5.1)
Sodium: 135 mmol/L — ABNORMAL LOW (ref 136–145)

## 2014-01-13 LAB — CBC WITH DIFFERENTIAL/PLATELET
Basophil #: 0.1 10*3/uL (ref 0.0–0.1)
Basophil %: 0.7 %
EOS PCT: 5.4 %
Eosinophil #: 0.6 10*3/uL (ref 0.0–0.7)
HCT: 31.9 % — AB (ref 40.0–52.0)
HGB: 10.2 g/dL — ABNORMAL LOW (ref 13.0–18.0)
Lymphocyte #: 0.5 10*3/uL — ABNORMAL LOW (ref 1.0–3.6)
Lymphocyte %: 4.8 %
MCH: 29.8 pg (ref 26.0–34.0)
MCHC: 32 g/dL (ref 32.0–36.0)
MCV: 93 fL (ref 80–100)
MONO ABS: 1.5 x10 3/mm — AB (ref 0.2–1.0)
MONOS PCT: 13.1 %
Neutrophil #: 8.5 10*3/uL — ABNORMAL HIGH (ref 1.4–6.5)
Neutrophil %: 76 %
Platelet: 352 10*3/uL (ref 150–440)
RBC: 3.43 10*6/uL — ABNORMAL LOW (ref 4.40–5.90)
RDW: 16.8 % — ABNORMAL HIGH (ref 11.5–14.5)
WBC: 11.2 10*3/uL — ABNORMAL HIGH (ref 3.8–10.6)

## 2014-01-14 LAB — CULTURE, BLOOD (SINGLE)

## 2014-01-17 ENCOUNTER — Ambulatory Visit: Payer: Self-pay | Admitting: Internal Medicine

## 2014-05-13 ENCOUNTER — Emergency Department: Payer: Self-pay | Admitting: Emergency Medicine

## 2014-05-13 LAB — CK TOTAL AND CKMB (NOT AT ARMC)
CK, Total: 55 U/L
CK-MB: 1.1 ng/mL (ref 0.5–3.6)

## 2014-05-13 LAB — URINALYSIS, COMPLETE
BACTERIA: NONE SEEN
BILIRUBIN, UR: NEGATIVE
Blood: NEGATIVE
GLUCOSE, UR: NEGATIVE mg/dL (ref 0–75)
Ketone: NEGATIVE
LEUKOCYTE ESTERASE: NEGATIVE
Nitrite: NEGATIVE
PH: 7 (ref 4.5–8.0)
Protein: 30
SQUAMOUS EPITHELIAL: NONE SEEN
Specific Gravity: 1.005 (ref 1.003–1.030)
WBC UR: <1 /HPF (ref 0–5)

## 2014-05-13 LAB — CBC
HCT: 35.4 % — AB (ref 40.0–52.0)
HGB: 11.3 g/dL — ABNORMAL LOW (ref 13.0–18.0)
MCH: 30.1 pg (ref 26.0–34.0)
MCHC: 31.8 g/dL — ABNORMAL LOW (ref 32.0–36.0)
MCV: 95 fL (ref 80–100)
PLATELETS: 334 10*3/uL (ref 150–440)
RBC: 3.75 10*6/uL — AB (ref 4.40–5.90)
RDW: 16.7 % — ABNORMAL HIGH (ref 11.5–14.5)
WBC: 11.2 10*3/uL — ABNORMAL HIGH (ref 3.8–10.6)

## 2014-05-13 LAB — BASIC METABOLIC PANEL
Anion Gap: 4 — ABNORMAL LOW (ref 7–16)
BUN: 29 mg/dL — AB (ref 7–18)
CHLORIDE: 102 mmol/L (ref 98–107)
Calcium, Total: 8.2 mg/dL — ABNORMAL LOW (ref 8.5–10.1)
Co2: 30 mmol/L (ref 21–32)
Creatinine: 2.19 mg/dL — ABNORMAL HIGH (ref 0.60–1.30)
EGFR (African American): 37 — ABNORMAL LOW
EGFR (Non-African Amer.): 30 — ABNORMAL LOW
Glucose: 105 mg/dL — ABNORMAL HIGH (ref 65–99)
Osmolality: 278 (ref 275–301)
Potassium: 4.3 mmol/L (ref 3.5–5.1)
Sodium: 136 mmol/L (ref 136–145)

## 2014-05-13 LAB — TROPONIN I: Troponin-I: <0.02 ng/mL

## 2014-09-10 IMAGING — CR DG CHEST 1V PORT
1 series · 1 of 1 positions shown · non-contrast
Comparison: Portable exam 2265 hr compared to 10/10/2013

CLINICAL DATA: Weakness, confusion

EXAM:
PORTABLE CHEST - 1 VIEW

[ap]
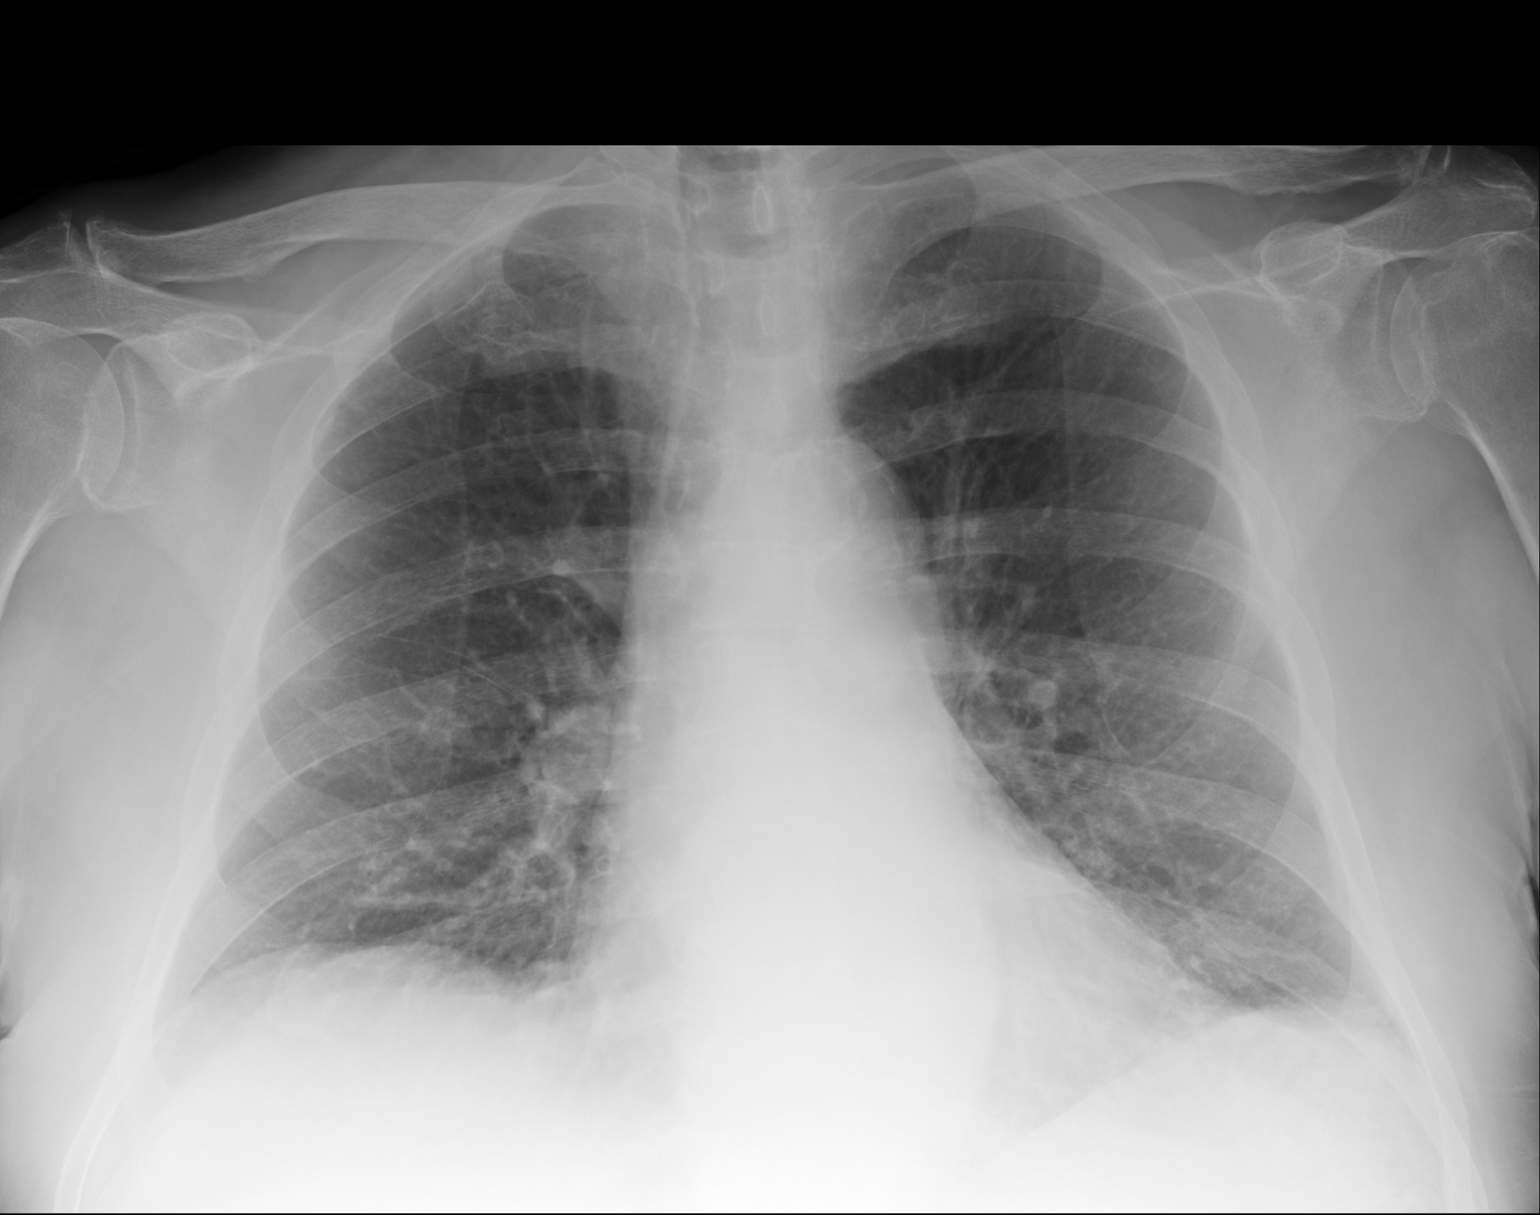

[1 of 1 positions shown; findings below may reference images not displayed]

FINDINGS: Normal heart size and pulmonary vascularity.

Calcified mildly tortuous thoracic aorta.

Peribronchial thickening with minimal bibasilar atelectasis.

Lungs otherwise clear.

No pleural effusion or pneumothorax.

Slight osseous demineralization.
IMPRESSION: Bronchitic changes with minimal bibasilar atelectasis.

## 2014-12-10 NOTE — Consult Note (Signed)
PATIENT NAME:  Terry Johnson, Terry Johnson MR#:  [id number] DATE OF BIRTH:  08-01-1921  DATE OF CONSULTATION:  10/11/2013  REFERRING PHYSICIAN:  Rusty Aus, MD CONSULTING PHYSICIAN:  Theodore Demark, NP  GI consult was ordered by Dr. Emily Filbert for evaluation of anemia.   Appreciate consult for 81 year old Caucasian man admitted with anemia, weakness, itching, suicidal ideation, for evaluation of anemia. Please reference H and P and psych consult for evaluation of non-GI complaints. Both the patient and his family deny abdominal pain, melena, hematochezia, NVD, problem swallowing, weight loss, and all other GI complaints. They state he eats well. Currently his only NSAID seems to be ASA 81 mg p.o. daily. Colonoscopy last 2009 or 2007 at the [place]. There is a history of prior colon polyps. Had EGD 2009 last with esophagitis with glycogenic acanthosis, gastritis, fundic gland polyp, and duodenitis with glandular atypia but would not exclude dysplasia. Did undergo EUS afterwards at [facility], which did not find anything significant in the duodenum, pancreas, and common bile duct. Of note, he also has stage III kidney disease and recent evaluation with Dr. Ma Hillock in the hematology clinic Friday, although I do not see a dictated note presently. Hemoglobin has also been improving over the last few weeks, was 7.8 on 02/12 and is now 9.5 on current therapy of PPI, iron, folate, and B12 according to clinic and hospital notes. Has had negative Coombs test, serum electrophoresis without M spike. Did have echocardiogram in 12/14 with LVEF of 50% to 55%. Does state he is feeling better today than yesterday.   PAST MEDICAL HISTORY: Hypertension, hyperlipidemia, BPH, GERD, V. tach on amiodarone, CKD stage III, degenerative disk disease with multiple back surgeries, bilateral hip replacement, status post hernia repair, post bilateral cataract repair.   ALLERGIES: CODEINE, MORPHINE, ACE INHIBITORS.   MEDICATIONS: Iron daily, ASA  81 mg p.o. daily, Effexor XR 75 daily, simvastatin 40 mg p.o. daily, folate 1 mg p.o. daily, vitamin B12 once daily, PreserVision once daily, pantoprazole 40 mg p.o. daily, Aricept 10 mg p.o. at bedtime, amiodarone 400 mg p.o. daily, Neurontin 100 mg p.o. at bedtime, HCTZ 25 mg p.o. daily, biotin 1 p.o. daily, terbinafine 250 mg p.o. daily.   SOCIAL HISTORY: Transitioning from living independently to living at [facility] in assisted care. No EtOH, tobacco. He is a World War II veteran.   FAMILY HISTORY: No significant family diseases reported.  REVIEW OF SYSTEMS: Ten systems reviewed. Significant for generalized pruritus as noted above and hearing loss (he does have a history of wearing bilateral hearing aids), and some mild dementia symptoms. Otherwise, systems review is negative.   LABORATORY AND RADIOLOGICAL DATA: Most recent labs: Glucose 94, BUN 24, creatinine 1.7, sodium 139, potassium 3.8, chloride 105, GFR 35, calcium 8.7, magnesium 2.1, LDH 190, lipase 201. Thyroid and hepatic panels are normal. WBC 11.1, hemoglobin 9.5, hematocrit 29.3, platelet count 327, MCV 103. Coombs test negative. Folate level 48.6. TIBC 364, iron saturation 6, serum iron 22, ferritin 63. ESR 32.   Did have a CT scan that demonstrates a stable liver lesion dating back to 2009, thought to be a hemangioma. Some colonic diverticula without signs of diverticulitis. COPD and some liver and renal cysts that are very stable, and some arthritic changes, otherwise pretty unremarkable.   PHYSICAL EXAMINATION: VITAL SIGNS: Most recent: Temperature 98.1, pulse 78, respiratory rate 18, blood pressure 170/74, SaO2 of 96%.  GENERAL: Pleasant elderly man, minimally confused, in no acute distress.  HEENT: Normocephalic, atraumatic. Sclerae  clear, conjunctivae pink.  NECK: Supple. No JVD or thyromegaly.  CHEST: Respirations eupneic. Lungs clear.  CARDIAC: S1, S2, RRR. No MRG.  ABDOMEN: Flat, soft bowel sounds, nondistended,  nontender, rather soft. No guarding, rigidity, peritoneal signs, hepatosplenomegaly, bruits or other abnormalities.  SKIN: Warm, dry, pink. Do note scattered ecchymotic/purpuric lesions to the arms.  EXTREMITIES: No clubbing. No cyanosis. No edema. Gait steady with walker. Able to stand without assist. NEUROLOGIC: Alert, oriented x 3. Some hearing loss, aided by hearing aids. Cranial nerves II through XII otherwise intact. Speech clear. No facial droop   IMPRESSION AND PLAN: Iron deficiency anemia with heme-positive stool. I think that his anemia is likely multifactorial and has been improving; however, he does seem to be losing some blood through the GI tract. Discussed indications, risks, benefits for both upper and lower endoscopy with the patient and family in regards to age and health. They would like to discuss this and will let us know how they would like to proceed.   Thank you very much for this consult.   These services were provided by Stephens November, MSN, [facility], in collaboration with Lollie Sails, MD, with whom I have discussed this patient in full.   ____________________________ Theodore Demark, NP chl:jcm D: 10/11/2013 18:22:03 ET T: 10/11/2013 18:46:04 ET JOB#: [id number]  cc: Theodore Demark, NP, <Dictator> [place] SIGNED 10/20/2013 8:19

## 2014-12-10 NOTE — Discharge Summary (Signed)
PATIENT NAME:  Terry Johnson, Terry Johnson MR#:  [id number] DATE OF BIRTH:  November 30, 1920  DATE OF ADMISSION:  10/10/2013 DATE OF DISCHARGE:  10/12/2013  DISCHARGE DIAGNOSES: 1.  Symptomatic anemia.  2.  Severe itching with depression.  3.  Acute on chronic renal failure, chronic kidney disease III.  4.  History of Ventricular tachycardia. 5.  Hypertension.  6.  Hyperlipidemia.   DISCHARGE MEDICATIONS: Folic acid 1 mg daily, venlafaxine 75 mg q. a.m., vitamin B12 1000 mcg daily, amiodarone 400 mg q. a.m., PreserVision vitamin b.i.d., pantoprazole 40 mg b.i.d., Tylenol 650 mg q. 4 p.r.n., hydroxyzine 10 mg q. 8 p.r.n. itching, amlodipine 10 mg daily, doxepin 10 mg at bedtime for itching.   REASON FOR ADMISSION: A 81 year old male who presents with severe itching and symptomatic anemia. Please see H and P for HPI, past medical history, and physical exam.   HOSPITAL COURSE: The patient was admitted. Hemoglobin was 8.4. Initially was screened for suicidal thoughts, but this was thought due to the itching. Once the itching was controlled with hydroxyzine and doxepin, those thoughts have resolved. He currently has no suicidal thoughts. There was discussion as to what to do with his anemia. His retic count was elevated. He is followed by hematology. Iron really has not made much of a difference. He was transfused with 1 unit PRBC with his hemoglobin going from 8.4 to 9.5 and was stable. He is slightly heme positive. Due to comorbid conditions, GI deferred on endoscopy and will go with b.i.d. pantoprazole with followup with hematology. He will stop his iron as there has not been helpful in the past and could be contributing to the itching. His biotin, 81 mg aspirin, Aricept, and HCTZ will be stopped for now. Blood pressure has been controlled with these medications. We will watch for lower extremity edema. [facility]. Overall prognosis is guarded.   ____________________________ Danella PentonMark F. Miller, MD mfm:sb D: 10/12/2013 07:30:20 ET T: 10/12/2013 09:24:17 ET JOB#: [id number]  cc: Danella PentonMark F. Miller, MD, <Dictator> Danella PentonMARK F MILLER MD ELECTRONICALLY SIGNED 10/13/2013 8:08

## 2014-12-10 NOTE — H&P (Signed)
PATIENT NAME:  Terry Johnson, Terry Johnson MR#:  [id number] DATE OF BIRTH:  02-21-1921  DATE OF ADMISSION:  01/10/2014  PRIMARY CARE PHYSICIAN:  Dr. Bethann PunchesMark Miller, [facility].  CHIEF COMPLAINT:  Decreased mentation, not behaving his usual self. The patient has been falling. He is not able to walk as usual.   HISTORY OF PRESENT ILLNESS:  Terry Johnson is a 81 year old Caucasian gentleman with past medical history of MI, hiatal hernia, GERD, and history of chronic back pain, comes in from home, brought in by family members after he has been experiencing multiple falls in the last several days. The patient was seen on May 22nd after he had fallen and seen in the Emergency Room where he had CT of the head, which was essentially negative for any trauma. CT cervical spine was negative for trauma as well. He was complaining of pain in his right hand. Right hand x-ray on May 22nd was essentially negative. The patient was discharged from the Emergency Room. He came back on the 24th complaining of swelling of his right wrist where he had fallen, and it was found he had a triquetral fracture of indeterminate age, along with some minimal swelling. Orthopedic consultation was obtained by ER physician at that time, and the patient was given a soft cast and sent home. Blood cultures were drawn. The patient's creatinine was 1.3 at that time. He was discharged on p.o. antibiotics, which was Keflex. The patient comes back again today given decreased mentation, lethargy, unable to function at home with falls, and is being admitted with acute-on-chronic renal failures, possible right hand cellulitis with elevated white count. Chest x-ray is negative for any pneumonia. The patient received a dose of IV vancomycin in the Emergency Room. He also is getting IV fluids.   PAST MEDICAL HISTORY:  As listed:  1.  GERD.  2.  History of arrhythmia.    3.  Arthritis.  4.  Chronic anemia.  5.  Chronic back pain.  6.  CKD stage 3.  7.   Seizures, questionable.  8.  Hypertension.  9.  History of coronary artery disease.  10.  Prostate surgery.  11.  Cataract surgery.  12.  Left hip total replacement.  13.  Hernia repair.  14.  Lower back surgery x4.  15.  History of MI.   HOME MEDICATIONS:  1.  Vitamin B12 at 1000 mcg p.o. daily.  2.  Venlafaxine 75 mg p.o. daily.  3.  Tramadol 50 mg q.4 hourly.  4.  PreserVision 1 capsule b.i.d.  5.  Protonix 40 mg daily.  6.  Norvasc 10 mg daily at bedtime.  7.  Keflex 500 mg b.i.d. This was started on May 24th in the Emergency Room.  8.  Hydroxyzine hydrochloride 10 mg 1 tablet once a day at bedtime.  9.  Gabapentin 100 mg daily in morning.  10.  Foley acid 1 mg p.o. daily.  11.  Ferrous sulfate 325 mg p.o. daily.  12.  Bupropion 75 mg daily.  13.  Biotin 1000 mcg p.o. daily.  14.  Aspirin 81 mg daily.  15.  Amiodarone 200 mg 2 tablets once a day in the morning.   ALLERGIES:  CODEINE and MORPHINE.   SOCIAL HISTORY:  Lives at home. His caregivers, who help him out during the daytime. A remote history of tobacco abuse. Denies alcohol or drug use.   FAMILY HISTORY:  Denies if any known cardiovascular illnesses.   REVIEW OF SYSTEMS:  CONSTITUTIONAL:  No  fever or fatigue. Positive for weakness.  EYES:  No blurred or double vision. Positive for cataract surgery.  ENT:  No tinnitus, ear pain, hearing loss.  RESPIRATORY:  No cough, wheeze, hemoptysis, shortness of breath, or COPD.  CARDIOVASCULAR:  No chest pain, orthopnea, edema, or dyspnea on exertion.  GASTROINTESTINAL:  No nausea, vomiting, diarrhea.  NEUROLOGIC:  No CVA, TIA. Moves all extremities well, although has weakness. No dysarthria. No vertigo or tremors.  PSYCHIATRIC:  No anxiety or depression. All other systems reviewed are negative.  SKIN:  No acne or rash.   PHYSICAL EXAMINATION:  GENERAL:  The patient is awake. He is alert. He is somewhat confused, pleasantly confused.  VITAL SIGNS:  Afebrile. Pulse is 80,  blood pressure is 170/60, sats are 96% on room air.  HEENT:  Atraumatic, normocephalic. PERRLA. EOM intact. Oral mucosa is dry.  NECK:  Supple. No JVD. No carotid bruit.  RESPIRATORY:  Clear to auscultation bilaterally. No rales, rhonchi, respiratory distress, or labored breathing.  CARDIOVASCULAR:  Both heart sounds are normal. Rate, rhythm regular. PMI not lateralized. Chest nontender.  EXTREMITIES:  Good pedal pulses, good femoral pulses, 1+ pitting edema. Right hand; the patient has a soft cast present. He does have some swelling in his fingers as noted under the cast. He is able to move his fingers well. I do not feel any evidence of compartment syndrome. ABDOMEN:  Soft, benign, nontender. No organomegaly. Positive bowel sounds.  SKIN:  Warm and dry.  PSYCHIATRIC:  The patient is pleasantly confused. Otherwise he is alert and oriented x2.   LABORATORY DATA:  White count is 16.1, H and H is 11.1 and 34.3, platelet count is 342. Troponin is 0.02. Glucose is 128, BUN is 24, creatinine 1.85, sodium is 134. LFTs within normal limits. UA negative for UTI.   CHEST X-RAY:  No acute cardiopulmonary abnormality.   ASSESSMENT:  A 81 year old Terry Johnson with history of coronary artery disease, history of myocardial infarction, gastroesophageal reflux disease, chronic back pain, and frequent falls at home, comes in with:  1.  Acute altered mental status/acute encephalopathy suspected due to dehydration with acute-on-chronic renal failure. The patient's baseline creatinines are 1.3. He clinically appears dehydrated. Creatinine is up to 1.85. We will give IV fluids; monitor ins and outs, metabolic panel; and avoid nephrotoxins.  2.  Right wrist suspected cellulitis versus swelling/contusion secondary to fall and fracture. The patient's right hand is in a soft cast. I will cover him with IV Ancef empirically for cellulitis with elevated leukocytosis, have orthopedics see the patient in consultation given his  fracture in the right hand. We will follow up blood cultures. Blood cultures from May 24th have been essentially negative. We will watch for fever curve.  3.  Frequent falls and unsteady gait. Physical therapy to evaluate when appropriate.  4.  Chronic back pain. Continue tramadol 50 mg every 4 hours as needed.   5.  History of ventricular arrhythmias on amiodarone, which we will continue.  6.  Hypertension. The patient's blood pressure is stable. We will continue amlodipine 10 mg.  7.  Gastroesophageal reflux disease. Continue Protonix.  8.  Further workup according to the patient's clinical course. Hospital admission plan was discussed with the patient and the patient's family members.   TIME SPENT:  50 minutes.     ____________________________ Wylie Hail Allena Katz, MD sap:ms D: 01/10/2014 20:50:22 ET T: 01/10/2014 21:21:30 ET JOB#: [id number]  cc: Kerrilynn Derenzo A. Allena Katz, MD, <Dictator> Danella Penton, MD Saleen Peden  A Rydan Gulyas MD ELECTRONICALLY SIGNED 01/31/2014 14:13

## 2014-12-10 NOTE — Consult Note (Signed)
Brief Consult Note: Diagnosis: Mood Disorder, Dementia.   Patient was seen by consultant.   Orders entered.   Discussed with Attending MD.   Comments: Pt seen in ED Hallway. he is a 81 [place] His son is POA. He stated that he was brought in as he tripped and fell on some chairs and used cuss words as he was upset. However, he denied having any Si/Hi or plans. Pt stated that he follows with Dr Hyacinth MeekerMiller. He remembered their ages. He stated that he is taking the medications as prescribed. No perceptual disturbances noted.   Plan; His son Leotis ShamesJeffery was [facility] and he agreed that his father should not be sent to hospital at this time.  I will release him from IVC as he is not suicidal or homicidal.  He will follow up with Dr Hyacinth MeekerMiller- PCP after d/c from hospital, when he is medically stable.  No change in psychiatric medications.   Thank you for allowing me to participate in care of this pt.  Electronic Signatures: Rhunette CroftFaheem, Emelia Sandoval S (MD)  (Signed 24-Mar-15 12:44)  Authored: Brief Consult Note   Last Updated: 24-Mar-15 12:44 by Rhunette CroftFaheem, Vance Belcourt S (MD)

## 2014-12-10 NOTE — Consult Note (Signed)
Chief Complaint:  Subjective/Chief Complaint Patient seen and examined, please see full GI consult and brief consult note. Patietn presenting with mild anemia and heme positive stool.  Patient denies n/v abdominal pain, or black stools.  Stools currently brown heme positive.  I discussed case with one of the patients sons and patient.  Will approach for now conservatively with H. pylori testing, bid pppi as you are.  Consider UGIS before considering sedated luminal evaluation in the setting of patient with significant cardiac disorder.  Will discuss further with Dr Hyacinth MeekerMiller in the am.   VITAL SIGNS/ANCILLARY NOTES: **Vital Signs.:   23-Feb-15 05:20  Diastolic BP (mmHg) Diastolic BP (mmHg) 75   Electronic Signatures: Barnetta ChapelSkulskie, Martin (MD)  (Signed 23-Feb-15 18:34)  Authored: Chief Complaint, VITAL SIGNS/ANCILLARY NOTES   Last Updated: 23-Feb-15 18:34 by Barnetta ChapelSkulskie, Martin (MD)

## 2014-12-10 NOTE — H&P (Signed)
PATIENT NAME:  Terry Johnson, Terry Johnson MR#:  [id number] DATE OF BIRTH:  [birth year]  DATE OF ADMISSION:  11/24/2013  REFERRING PHYSICIAN:  Dr. Margarita Grizzle.  PRIMARY CARE PHYSICIAN: Bethann Punches of [facility].   CHIEF COMPLAINT: Shortness of breath.   A 81 year old gentleman with history of coronary artery disease, hypertension, significant tobacco abuse presenting with shortness of breath. Describes 2 day duration of shortness of breath, as well as dyspnea on exertion with associated nonproductive cough. Denies any fevers, chills or further symptomatology. He actually saw his PCP today and was given amoxicillin; however, symptoms worsened and thus presented to the Emergency Department for further workup and evaluation. He is still currently complaining of shortness of breath and cough, but otherwise no further complaints.   REVIEW OF SYSTEMS:  CONSTITUTIONAL: Positive for fatigue, weakness. Denies fevers, chills.  EYES: Denies blurred vision, double vision, eye pain.  EARS, NOSE, THROAT: Denies tinnitus, ear pain or hearing loss.  RESPIRATORY: Positive for cough, shortness of breath.  CARDIOVASCULAR: Denies chest pain, palpitations.  GASTROINTESTINAL: Denies nausea, vomiting, diarrhea or abdominal pain.  GENITOURINARY: Denies dysuria or hematuria.  ENDOCRINE: Denies nocturia or thyroid problems.  HEMATOLOGIC AND LYMPHATIC: Denies easy bruising or bleeding.  SKIN: Denies rashes or lesions.  MUSCULOSKELETAL: Denies pain in neck, back, shoulder, knees, hips or arthritic symptoms.  NEUROLOGIC: Denies paralysis or paresthesias.  PSYCHIATRIC: Denies anxiety or depressive symptoms. Otherwise, full review of systems performed by me is negative.   PAST MEDICAL HISTORY: Coronary artery disease, gastroesophageal reflux disease, history of ventricular arrhythmias on amiodarone, hypertension, hyperlipidemia and BPH.   SOCIAL HISTORY: Positive for remote tobacco abuse. Denies any alcohol or drug usage.    FAMILY HISTORY: Denies any known cardiovascular illnesses.   ALLERGIES: CODEINE AND MORPHINE.   HOME MEDICATIONS: Include aspirin 81 mg p.o. daily, amiodarone 200 mg 2 tabs p.o. daily, gabapentin 100 mg p.o. daily, venlafaxine 75 mg p.o. daily, simvastatin 40 mg p.o. daily, hydroxyzine 10 mg p.o. b.i.d., donepezil 10 mg p.o. daily, hydrochlorothiazide 25 mg p.o. daily, Feosol 325 mg p.o. daily, pantoprazole 40 mg p.o. daily, PreserVision multivitamin 1 capsule p.o. b.i.d., Biotin 1000 mcg p.o. daily, folic acid 1 mg p.o. daily, vitamin B12, 1000 mcg p.o. daily.   PHYSICAL EXAMINATION: VITAL SIGNS: Temperature 100, heart rate 75, respirations 22, blood pressure 163/75, saturating 97% on supplemental O2. Weight 73.5 kg. BMI 23.2.  GENERAL: Well-nourished, well-developed, Caucasian male, currently in no acute distress.  HEAD: Normocephalic, atraumatic.  EYES: Pupils equal, round and reactive to light. Extraocular muscles intact. No scleral icterus.  MOUTH: Moist mucous membranes membrane. Dentition intact. No abscess noted.  EAR, NOSE, THROAT:  Throat clear without exudates. No external lesions.  NECK: Supple. No thyromegaly. No nodules. No JVD.  PULMONARY: Diffuse coarse breath sounds throughout all lung fields with prolonged expiratory phase with expiratory wheezing. Good respiratory effort.  CHEST: Nontender to palpation.  CARDIOVASCULAR: S1, S2, regular rate and rhythm. No murmurs, rubs or gallops. No edema. Pedal pulses 2+ bilaterally.  GASTROINTESTINAL: Soft, nontender, nondistended. No masses. Positive bowel sounds.  No hepatosplenomegaly.  MUSCULOSKELETAL: No swelling, clubbing or edema. Range of motion full in all extremities.  NEUROLOGIC: Cranial nerves II through XII intact. No gross focal neurological deficits. Sensation is intact. Reflexes are intact. SKIN: No ulcerations, lesions, rash, cyanosis. Skin warm, dry. Turgor intact.  PSYCHIATRIC: Mood and affect within normal limits.   The patient is awake, alert and oriented x 3.  Insight and judgment intact.   IMAGING AND LABORATORY DATA:  Chest x-ray performed revealing no acute cardiopulmonary process. Remainder of laboratory data includes sodium 135, potassium 4, chloride 102, bicarb 30, BUN 29, creatinine 2.18, glucose 115, total protein 6.3, albumin 2.9, bilirubin 0.1. Troponin less than 0.02. WBC 8.4, hemoglobin 7.5, platelets of 303.   ASSESSMENT AND PLAN: A 81 year old Caucasian gentleman with history of hypertension, coronary artery disease, tobacco abuse, presenting with 2 day duration of shortness of breath.  1. Chronic obstructive pulmonary disease exacerbation. Provide DuoNeb therapies q.4 hours, supplemental O2 to keep SaO2 greater than 92%, Solu-Medrol 60 mg IV daily, azithromycin given cough.  2.  Hyperlipidemia. Continue statin therapy.  3.  Coronary artery disease.  Continue aspirin therapy and statin therapy.  4.  Venous thromboembolism prophylaxis with heparin subQ.   The patient is FULL CODE.  TIME SPENT: 45 minutes.    ____________________________ Cletis Athensavid K. Hower, MD dkh:dmm D: 11/24/2013 22:42:52 ET T: 11/24/2013 22:58:49 ET JOB#: [id number]  cc: Cletis Athensavid K. Hower, MD, <Dictator> DAVID Synetta ShadowK HOWER MD ELECTRONICALLY SIGNED 11/25/2013 13:47

## 2014-12-10 NOTE — H&P (Signed)
PATIENT NAME:  Terry Johnson, Terry Johnson MR#:  [id number] DATE OF BIRTH:  08-08-1921  DATE OF ADMISSION:  10/10/2013  CHIEF COMPLAINT: Weakness, itching suicidal ideation.   HISTORY OF PRESENT ILLNESS: A 81 year old male with history of significant itching extending back nine months to 12 months, has gotten more intense recently. It had reached the point today where he had a knife at home and was discussing killing himself. Also has trouble with back pain in the mid back that contributes to the above. He has a long history of anemia, recently had seen oncology; evaluation there revealed iron deficiency with elevated reticulocyte count and elevated MCV, normal LDH level, normal haptoglobin, which appears to be consistent with blood loss anemia. In the Emergency Room, he was noted to be heme positive. He has developed increasing weakness to the point where he is not able to walk. Due to this progressive anemia with heme positive stool, progressive weakness and suicidal ideation, he is admitted now for further evaluation of this as well as pruritus and back pain.   PAST MEDICAL HISTORY: 1. Hypertension.  2. Hyperlipidemia.  3. Benign prostatic hypertrophy.  4. Gastroesophageal reflux disease.  5. History of V. tach on amiodarone.  6. Chronic kidney disease, stage III.  7.  History of degenerative disease with multiple prior back surgeries.  8. History of bilateral hip replacements.  9. Status hernia repair.  10. Status post bilateral cataract repair.   ALLERGIES: CODEINE, MORPHINE, ACE INHIBITORS.   MEDICATIONS: 1. Oral iron.  2. Aspirin 81 mg p.o. daily.  3. Effexor XR 75 mg p.o. daily.  4. Simvastatin 40 mg p.o. daily.  5. Folate 1 mg p.o. daily.  6. Folate 1 mg p.o. daily. 7. Vitamin B12 1 mg p.o. daily.  8. PreserVision 1 p.o. daily.  9. Pantoprazole 40 mg p.o. daily.  10. Aricept 10 mg p.o. at bedtime.  11. Amiodarone 400 mg p.o. daily.  12. Neurontin 100 mg p.o. at bedtime. 13. HCTZ 25 mg  p.o. daily. 14.  Biotin 1 p.o. daily.  15. Terbinafine 250 mg p.o. daily.   SOCIAL HISTORY: No alcohol or tobacco. World War II veteran.   FAMILY HISTORY: No significant contributing family diseases reported.   REVIEW OF SYSTEMS: Please see HPI. Denies fevers, chills, night sweats. Eats fairly well. Denies bowel or bladder changes. No night sweats. The remainder of review of systems is negative.   PHYSICAL EXAMINATION: VITAL SIGNS: Temperature 97.7, blood pressure 180/80, height is 5 feet 6 inches, weight 172 pounds.  GENERAL: Pleasant, elderly male, in no acute distress.  EYES: Pupils postoperative. Lids and conjunctivae are unremarkable.  EARS, NOSE, AND THROAT: External examination unremarkable.  OROPHARYNX: Moist without lesions.  NECK: Supple. Trachea midline. No thyromegaly.  HEART: Regular rate and rhythm without gallops or rubs.  LUNGS: Clear bilaterally without wheeze or retractions.  ABDOMEN: Soft, nontender, positive bowel sounds. No guard or rebound.  SKIN: Multiple areas of excoriation over the back and arms. Multiple purpuric lesions across the chest wall.  LYMPH NODES: No cervical or supraclavicular nodes.  MUSCULOSKELETAL: No clubbing, cyanosis, significant edema.  NEUROLOGIC: Cranial nerves appear intact, other than some hearing loss. Moving all extremities.   LABORATORY, DIAGNOSTIC, AND RADIOLOGICAL DATA: Glucose 103 with BUN 32, creatinine 2.1. Electrolytes are unremarkable. Lipase 201. Albumin 3.4 with remainder of liver enzymes normal, including alkaline phosphatase at 79. Cardiac enzymes negative. White count 11.6 with hemoglobin 8.8, MCV 103, platelets 317. Recent labs include negative direct Coombs, negative antibody screen. He  has had prior electrophoresis which has been negative. Coagulation studies were normal.   IMPRESSION AND PLAN: 1. Iron deficiency anemia, heme-positive. It appears he may have a chronic gastrointestinal  blood loss. Unclear whether the  irondeficiency may be contributing to his pruritus, and he has a high potential of a malignancy contributing to the above. Gastroenterology consultation for further assistance. Has seen the oncologist outpatient, consider getting them involved as well. Consider IV iron infusion while he is here pending results of the above.  2. Pruritus as noted above, we will see whether this might be related to the anemia. The anemia has been long-standing. Trial of doxepin at night, hydroxyzine p.r.n. The side effects of sedation was discussed with the patient and family members.  3. History of ventricular tachycardia. Continue on amiodarone. Telemetry monitor. Check TSH.  4. Hypertension. Would add amlodipine. Hold HCTZ.  5. Suicidal ideation. We will ask psychiatry see the patient. It appears that the patient is uncomfortable enough that this drove him to the statement as above. Hopefully, we can keep  his above symptoms controlled. This would lessen chance of anything occurring. We will hold Effexor. We will give a trial of doxepin as noted above.     ____________________________ Lynnea FerrierBert J. Klein III, MD bjk:sg D: 10/10/2013 11:04:19 ET T: 10/10/2013 11:46:14 ET JOB#: [id number]  cc: Curtis SitesBert J. Klein III, MD, <Dictator> Daniel NonesBERT KLEIN MD ELECTRONICALLY SIGNED 10/13/2013 8:06

## 2014-12-10 NOTE — Consult Note (Signed)
PATIENT NAME:  Terry Johnson, Terry Johnson MR#:  [id number] DATE OF BIRTH:  Jan 13, 1921  DATE OF CONSULTATION:  10/10/2013  REFERRING PHYSICIAN:   CONSULTING PHYSICIAN:  Talton Delpriore K. Guss Bundehalla, MD  PLACE OF DICTATION:  [facility], [place], room [address]246, [place] .  AGE:  81 years.   SEX:  Male.  RACE:  White.  SUBJECTIVE:  The patient was seen in room #246 in consultation.  The patient was seen along with a sitter and with his pastor of many years.  The patient is retired after having worked for [employer].  He is widowed for a few years after he lost his wife of many years.  The patient lives by himself in the same house.  He has three sons that come visit him and take care of him and help as needed with the household.    PAST PSYCHIATRIC HISTORY:  No previous history of inpatient psychiatry.  No history of suicidal ideation.  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    ALCOHOL AND DRUGS:  Denied.  According to the information obtained from the staff and the chart, the patient was [facility] as he had been living by himself and they gave him some medication which caused him to have rash.  However, the pastor reported that he had the similar rash before, it only got worse and this is being addressed by his physicians.  The patient started getting depressed and made suicidal wishes which was a concern and a consult was requested.    MENTAL STATUS:  The patient is alert and oriented to place, person and time.  He is aware of the situation.  He knew his name and date and time and identified the pastor.  Affect is appropriate with his mood which is low and down and depressed, but denies any suicidal, homicidal idea or plans and he is confident that he is helped.  Memory is intact.  Cognition intact.  No psychosis.  Insight and judgment fair and adequate.  IMPRESSION:  Major depressive disorder, recurrent.  PLAN:  Continue current medications that is venlafaxine  75 mg by mouth daily and doxepin 10 mg by mouth at bedtime to help him rest and help with his mood.  The patient probably needs home health care so that he can get some help and he can talk to somebody.  The patient may be discharged and can be sent back home after he is medically cleared and stable and continued on the same antidepressant medications.    ____________________________ Jannet MantisSurya K. Guss Bundehalla, MD skc:ea D: 10/10/2013 20:27:14 ET T: 10/11/2013 00:11:18 ET JOB#: [id number]  cc: Monika SalkSurya K. Guss Bundehalla, MD, <Dictator> Beau FannySURYA K Brennley Curtice MD ELECTRONICALLY SIGNED 10/16/2013 22:46

## 2014-12-10 NOTE — Consult Note (Signed)
PATIENT NAME:  Terry Johnson, Terry Johnson MR#:  [id number] DATE OF BIRTH:  1921-06-23  DATE OF CONSULTATION:  11/07/2013  CONSULTING PHYSICIAN:  Emerly Prak K. Guss Bundehalla, MD  PLACE OF DICTATION: [facility] Emergency Room #23, [place], [place].  AGE: 81 years.  SEX: Male.  RACE: White.  SUBJECTIVE: The patient was in consultation room #23 Emergency Room [facility].  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 The patient comes to [facility] after he is brought by his son for "suicidal ideals".   HISTORY OF PRESENT ILLNESS: According to information obtained from the staff, the patient had been expressing suicidal ideas and not wanting to live anymore.   PAST PSYCHIATRIC HISTORY: No previous history of inpatient psychiatry.  No history of suicide attempts, but has falling risk and falling spells    MENTAL STATUS: The patient was seen lying on the bed, alert.  He said this was February of 2015. Affect is flat with mood low, and admits feeling down and depressed.  No psychosis. Denies auditory and visual hallucinations. However, admits that he is feeling frustrated and disgusted. He realizes that he is brought here by his son and is eager to go home as he feels that this is the 2nd day he has been here. Does not appear to be responding to internal stimuli. Currently, he denies any aggressive plans to hurt himself or others, but did express the same to the staff and family members, which was of big concern. Insight and judgement guarded.  IMPRESSION: Major depressive disorder with suicide ideals. Recommend the patient needs to be admitted to the geropsych facility for further help with his depression. ____________________________ Jannet MantisSurya K. Guss Bundehalla, MD skc:sg D: 11/07/2013 00:59:26 ET T: 11/07/2013 08:23:33 ET JOB#: [id number]  cc: Monika SalkSurya K. Guss Bundehalla, MD, <Dictator> Beau FannySURYA K Merica Prell  MD ELECTRONICALLY SIGNED 11/07/2013 23:30

## 2014-12-10 NOTE — Consult Note (Signed)
Brief Consult Note: Diagnosis: anemia/weakness.   Patient was seen by consultant.   Consult note dictated.   Comments: Appreciate consult for 81 y/o caucasian man admitted with anemia, weakness, itching, suicidal ideation, for evaluation of anemia. Both patient and his family deny abdominal pain, melena/hematochezia, NVD, problems swallowing, weight loss, and all GI complaints. They state he eats well. Current only NSAID seems to be 81mg  ASA po daily.  [facility], there is a history of prior colon polyps. Had EGD 2009 with esophagitis with glycogenic acanthosis,gastritis, fundic gland polyp, and duodenitis with glandular atypia- but would not exclude dysplasia. [facility] which did not find anything significant in the duodenum, pancreas, and CBD. Of note, he also has stage 3 kidney disease and recent evaluation with Dr Sherrlyn HockPandit in the hematology clinic last Friday, although I do not see a dictated note presently. Hgb has also been improving over the last few weeks: was 7.8 on 2/12  and now is 9.5 on current therapy (PPI. Fe, folate, B12) according to clinic/hospital notes. Has had negative Coombs test, serum electropheresis without m-spike. LVEF 12/14 50-55%. Does state he is feeling better today than yesterday. Impression and plan. IDA with heme positive stool. Think that his  anemia is likely multifactorial and has been improving.  However, does seem to be losing some through the GI tract. Discussed indications, risks/benefits for both upper and lower endoscopy with patient and family in regards to age, health. They would like to discuss this and let us know how they would like to proceed..  Electronic Signatures: Vevelyn PatLondon, Jaydien Panepinto H (NP)  (Signed 23-Feb-15 18:12)  Authored: Brief Consult Note   Last Updated: 23-Feb-15 18:12 by Keturah BarreLondon, Kaicee Scarpino H (NP)

## 2014-12-10 NOTE — Discharge Summary (Signed)
PATIENT NAME:  Terry Johnson, Terry Johnson MR#:  [id number] DATE OF BIRTH:  03/02/1921  DATE OF ADMISSION:  11/24/2013 DATE OF DISCHARGE:  11/27/2013  DISCHARGE DIAGNOSES: 1.  Chronic obstructive pulmonary disease exacerbation.  2.  Acute on chronic renal failure.   HISTORY OF PRESENT ILLNESS:  Please see initial history and physical for details.  Briefly, this is a very pleasant 81 year old gentleman with a history of COPD as well as chronic kidney disease, chronic anemia and mild to moderate dementia.  He was admitted with symptoms of shortness of breath, cough and wheezing on April 8th.  He was found to have COPD exacerbation.   HOSPITAL COURSE BY ISSUE:  1.  The patient was treated with nebs and low doses of steroids given some confusion he has had in the past.  He was also treated with ceftriaxone and azithromycin.  He clinically improved and on the day of discharge he will be transitioned to three more days of azithromycin.  He will be on a low dose prednisone taper.  Both of these were prescribed into [facility].  2.  Acute on chronic renal failure.  Creatinine improved from admission at 2.18, down to 1.8 on discharge.  He also has chronic anemia that was stable at 8.4.   DISCHARGE MEDICATIONS:  Please see [facility] discharge summary.  In brief, he will stay on his outpatient meds, but will continue three days of azithromycin as well as a prednisone taper.   DISCHARGE DIET:  Low salt, regular consistency.   DISCHARGE ACTIVITY:  As tolerated.   DISCHARGE FOLLOW-UP:  The patient will follow up with Dr. Hyacinth MeekerMiller in 1 to 2 weeks.  He will call if he gets any new or worsening symptoms.   DISCHARGE DISPOSITION:  [facility].   CODE STATUS:  THE PATIENT IS DO NOT RESUSCITATE.   TIME SPENT:  This discharge took 35 minutes.    ____________________________ Stann Mainlandavid P. Sampson GoonFitzgerald, MD dpf:ea D: 11/27/2013 13:36:21 ET T: 11/27/2013 16:26:13 ET JOB#: [id number]  cc: Stann Mainlandavid P. Sampson GoonFitzgerald, MD,  <Dictator> Aaronjames Kelsay Sampson GoonFITZGERALD MD ELECTRONICALLY SIGNED 11/30/2013 22:16

## 2014-12-10 NOTE — Discharge Summary (Signed)
PATIENT NAME:  Terry Johnson, Terry Johnson MR#:  [id number] DATE OF BIRTH:  06-Nov-1920  DATE OF ADMISSION:  01/10/2014 DATE OF DISCHARGE:  01/14/2014  DISCHARGE DIAGNOSES: 1.  Acute on chronic renal failure.  2.  Right wrist fracture.  3.  Mild aspiration pneumonia.  4.  Right hand cellulitis.  5.  Hypertension.   DISCHARGE MEDICATIONS: PreserVision b.i.d., folic acid 1 mg daily, Effexor 75 mg daily, amiodarone 200 mg b.i.d., gabapentin 100 mg daily, vitamin B12 1000 mcg daily, ferrous sulfate 325 mg daily, aspirin 81 mg daily, bupropion 75 mg daily, pantoprazole 40 mg daily, Norvasc 10 mg daily, hydroxyzine 10 mg at bedtime, tramadol 50 mg b.i.d. p.r.n. pain, Tylenol 650 mg q. 4 p.r.n., Augmentin 500 mg b.i.d. x1 week.   REASON FOR ADMISSION: A 81 year old male who presents with right wrist fracture, fever, and lethargy. Please see H and P for HPI, past medical history and physical exam.   HOSPITAL COURSE: The patient was admitted. Dr. Martha ClanKrasinski of orthopedics set his wrist fracture and will follow up with him. He had a mild aspiration pneumonia, treated with IV antibiotics, switched to p.o. Augmentin. His mental status improved with hydration, which improved his renal function. Overall prognosis is guarded with his multiple problems.   ____________________________ Danella PentonMark F. Kissie Ziolkowski, MD mfm:sb D: 01/14/2014 08:11:45 ET T: 01/14/2014 09:00:33 ET JOB#: [id number]  cc: Danella PentonMark F. Cinthya Bors, MD, <Dictator> Danella PentonMARK F Kaelynne Christley MD ELECTRONICALLY SIGNED 01/17/2014 8:13

## 2014-12-11 NOTE — H&P (Signed)
PATIENT NAME:  Terry Johnson, Terry Johnson MR#:  [id number] DATE OF BIRTH:  Dec 31, 1920  DATE OF ADMISSION:  12/17/2011  REFERRING PHYSICIAN: Dr. Glenetta Hew    PRIMARY CARE PHYSICIAN: Bethann Punches, MD   PRESENTING COMPLAINT: Cough, congestion, fevers, and chills.   HISTORY OF PRESENT ILLNESS: Terry Johnson is a pleasant 81 year old gentleman with history of hypertension, hyperlipidemia, history of anemia, B12 deficiency, and chronic kidney disease who was recently evaluated between 04/15 to 12/05/2011 for acute ventricular tachycardia with near syncope. The patient was discharged on amiodarone at the time. He represents with reports of developing cough and congestion four days ago and development of high fever today of 103. He endorses intermittent production of his cough. No hematemesis. He endorses cold chills for the past two days and nausea but no vomiting. Denies any sick contact. He has been short of breath for the past two days. The patient denies any palpitations, presyncope or syncope.   PAST MEDICAL HISTORY:  1. Admitted 04/15 to 12/05/2011 for ventricular tachycardia and near syncope. The patient resolved his arrhythmia and was discharged home on amiodarone. The patient had a catheterization done at that admission that showed no obstructive disease and an echocardiogram showed normal EF.  2. Hypertension.  3. Hyperlipidemia.  4. Benign prostatic hypertrophy.  5. Diverticulosis.  6. Chronic kidney disease, stage III, with baseline creatinine around 1.6.  7. Gastroesophageal reflux disease.   8. History of left foot frostbite in World War II.  9. Anemia.  10. B12 deficiency.  11. Osteoarthritis.    PAST SURGICAL HISTORY:  1. Lumbosacral laminectomy.  2. Right total hip replacement.  3. Left hip replacement.  4. Prostate surgery.  5. Hernia repairs.   ALLERGIES: Codeine and morphine.   MEDICATIONS:  1. Aspirin 81 mg daily.  2. Effexor-XR 150 mg daily.  3. Lisinopril 40 mg daily.   4. Omeprazole 20 mg daily.  5. Simvastatin 40 mg at bedtime.  6. Xanax 0.5 mg at bedtime as needed.  7. Slow Iron extended-release 47.5 mg daily.  8. Folic acid 1 mg daily.  9. PreserVision 1 capsule daily.  10. Vitamin B12 supplement.  11. Allegra 1 tsp b.i.d.  12. Amiodarone 400 mg daily.   SOCIAL HISTORY: He lives in [place] alone. He is widowed. He quit tobacco in 1975. Denies any alcohol use.   FAMILY HISTORY: Hypertension.  REVIEW OF SYSTEMS: CONSTITUTIONAL: Endorses fevers and chills. EYES: No changes in vision. ENT: Endorses nasal congestion. He wears hearing aids. RESPIRATORY: As per history of present illness. No hemoptysis. CARDIOVASCULAR: As per history of present illness. GI: Endorses nausea but no vomiting. He endorses constipation but no hematemesis or melena. GU: No dysuria or hematuria. Has history of benign prostatic hypertrophy. ENDOCRINE: No polyuria or polydipsia. HEME: No easy bleeding. SKIN: No ulcers. MUSCULOSKELETAL: Denies any joint pain or swelling. NEUROLOGIC: No history of stroke or seizure. PSYCH: Denies any suicidal ideation.     PHYSICAL EXAMINATION:   VITAL SIGNS: Temperature 102.9, pulse 102, respiratory rate 20, blood pressure 157/65, sating at 94% on room air.   GENERAL: Lying in bed in no apparent distress.   HEENT: Normocephalic, atraumatic. Pupils equal, symmetric, nonicteric. Nares without discharge. Moist mucous membrane.   NECK: Soft and supple. No adenopathy or JVP.   CARDIOVASCULAR: Tachy. No murmurs, rubs, or gallops.   LUNGS: Coarse breath sounds on the left base and diffuse wheezing. No use of accessory muscles or increased respiratory effort.   ABDOMEN: Soft. Positive bowel sounds. No mass appreciated.  EXTREMITIES: Trace edema bilaterally. Dorsal pedis pulses intact.   MUSCULOSKELETAL: No joint effusion.   SKIN: No ulcers.   NEUROLOGIC: No dysarthria or aphasia. Symmetrical strength. No focal deficits.   PSYCH: He is  alert and oriented. The patient is cooperative.   PERTINENT LABS AND STUDIES: Glucose 121, BUN 29, creatinine 2.12, sodium 136, potassium 4.8, chloride 103, carbon dioxide 24, calcium 8.2, WBC 15.9, hemoglobin 9.7, hematocrit 29.9, platelets 254, MCV 96.   EKG with sinus tachy of 101. No ST elevation or depression. There is T wave inversion in aVR. No Q wave.   ASSESSMENT AND PLAN: Terry Johnson is a pleasant 81 year old gentleman with history of hypertension, hyperlipidemia, nonsustained V. tach, B12 deficiency, and chronic kidney disease presenting with complaints of cough, congestion, weakness, fevers, and chills.  1. Pneumonia/sepsis. Given recent hospitalization, will be aggressive with his antibiotics and start on vancomycin and Zosyn. Will send sputum cultures with his report of intermittent productive sputum. Send blood cultures. Given reports of dysphasia, will also get a speech evaluation. Will start him on Solu-Medrol, oxygen as needed, and SVNs. Continue on IV fluids and continue to follow on off-unit tele. Follow his white count and fever curve.  2. Acute and chronic kidney disease with baseline creatinine around 1.6. As above, IV fluids. Will hold lisinopril for now.  3. History of nonsustained V. tach. Continue on amiodarone. Currently in normal sinus without evidence of V. tach.  4. Hyperlipidemia. Restart simvastatin.  5. Chronic anemia. His hemoglobin is at baseline. Continue his Slow Iron supplement.  6. Prophylaxis with aspirin, omeprazole, and heparin sub-Q.   TIME SPENT: Approximately 55 minutes spent on patient care.   ____________________________ Reuel DerbyAlounthith Mrytle Bento, MD ap:drc D: 12/17/2011 00:49:20 ET T: 12/17/2011 06:58:28 ET JOB#: [id number]  cc: Pearlean BrownieAlounthith Etty Isaac, MD, <Dictator> Danella PentonMark F. Miller, MD Reuel DerbyALOUNTHITH Chayanne Speir MD ELECTRONICALLY SIGNED 12/18/2011 22:59

## 2014-12-11 NOTE — Discharge Summary (Signed)
PATIENT NAME:  Tommi EmeryHOLLAND, Korrey C MR#:  [id number] DATE OF BIRTH:  Oct 29, 1920  DATE OF ADMISSION:  12/17/2011 DATE OF DISCHARGE:  12/19/2011  DISCHARGE DIAGNOSES:  1. Right-sided pneumonia with systemic inflammatory response syndrome.  2. Chronic kidney disease, stage III.  3. Benign prostatic hypertrophy.  4. Hypertension.  5. History of ventricular tachycardia.  6. Chronic anemia.  7. B12 deficiency.  8. Osteoarthritis.   DISCHARGE MEDICATIONS:  1. Aspirin 81 mg daily.  2. Cipro 250 mg b.i.d.  3. Ceftin 250 mg b.i.d.  4. Effexor-XR 150 mg daily.  5. Lisinopril 40 mg daily.  6. Omeprazole 20 mg daily.  7. Simvastatin 40 mg at bedtime.  8. Slow iron daily.  9. Folic acid 1 mg daily.  10. PreserVision 1 capsule daily.  11. Vitamin B12 supplement. 12. Allegra 1 tsp b.i.d.  13. Amiodarone 400 mg daily.   REASON FOR ADMISSION: 81 year old male presents with fever, mild hypotension and right infiltrate. Please see history and physical for history of present illness, past medical history, and physical exam.   HOSPITAL COURSE: Patient was admitted, treated with vancomycin and piperacillin.  Blood culture is negative. With rapidity of his illness pneumococcus is most likely the underlying etiology. He improved quickly. His fever went away. White count resolved. He was still with a mild cough and will go home on the above medications. Overall prognosis is guarded. Follow up with Dr. Hyacinth MeekerMiller on Monday.  ____________________________ Danella PentonMark F. Miller, MD mfm:cms D: 12/19/2011 07:24:07 ET T: 12/20/2011 09:44:48 ET JOB#: [id number]  cc: Danella PentonMark F. Miller, MD, <Dictator> MARK Sherlene ShamsF MILLER MD ELECTRONICALLY SIGNED 12/22/2011 10:46

## 2014-12-11 NOTE — Consult Note (Signed)
General Aspect the patient an 81 year old male with no prior cardiac history who presented with dizziness and was noted to have intermittent nonsustained wide-complex tachycardia.  Patient is quite active despite his age.  He walks his dog approximately half a mile to a mile daily.  On day of admission he noted episodes of dizziness which persisted throughout the day.  He also had right shoulder pain associated with the dizziness.  He denies syncope he denied diaphoresis or nausea or vomiting.  He states her right shoulder pain resolved as the dizziness resolved.  In the emergency room he was noted to have brief episodes of wide-complex tachycardia intermittent with sinus rhythm.  He was placed on an amiodarone drip has had no further arrhythmias.  He is ruled out for myocardial infarction.  He denies exertional chest pain or shoulder pain.  He denies any similar symptoms.  his potassium and magnesium were normal.  He has chronic kidney disease grade 3 with a baseline serum creatinine 1.5-1.7    Present Illness see above   Physical Exam:   GEN well developed, well nourished, no acute distress    HEENT PERRL, hearing intact to voice    NECK supple  No masses    RESP clear BS    CARD Regular rate and rhythm  No murmur    ABD denies tenderness  normal BS    LYMPH negative neck, negative axillae    EXTR negative cyanosis/clubbing, negative edema    SKIN normal to palpation    NEURO cranial nerves intact, motor/sensory function intact    PSYCH A+O to time, place, person   Review of Systems:   Subjective/Chief Complaint dizziness    General: No Complaints    Skin: No Complaints    ENT: No Complaints    Eyes: No Complaints    Neck: No Complaints    Respiratory: No Complaints    Cardiovascular: right shoulder pain    Gastrointestinal: No Complaints    Genitourinary: No Complaints    Vascular: No Complaints    Musculoskeletal: No Complaints    Neurologic: Dizzness     Hematologic: No Complaints    Endocrine: No Complaints    Psychiatric: No Complaints    Review of Systems: All other systems were reviewed and found to be negative    Medications/Allergies Reviewed Medications/Allergies reviewed     Hiatal Hernia:    GERD - Esophageal Reflux:    Arrythmias:    Back Pain, Chronic:    Arthritis:    Anemia:    Decreased Kidney Function:    Seizures(questionable):    HOH: bilateral hearing aids   hypertension:    Left Total Hip Replacement: 27-Sep-2008   prostate sugery:    Cataract Extraction:    Hip Replacement - Right:    hernia repair: 1975 &1976   lower back surgery x4:   Home Medications: Medication Instructions Status  acetaminophen-oxyCODONE 325 mg-5 mg tablet 1 tab(s) orally every 4 hours- as needed  Active  simvastatin 40 mg oral tablet 1   once a day PM Active  Nexium 40 mg oral delayed release capsule 1   once a day AM. TAKE AM OF SURGERY Active  folic acid 1 mg oral tablet 1 tab(s) orally once a day AM Active  alprazolam 0.5 mg oral tablet  1/2 tab(s) orally once a day (at bedtime)  Active  lisinopril 40 mg oral tablet 1 tab(s) orally once a day PM Active  PreserVision oral tablet 1 tab(s)  orally 2 times a day  Active  aspirin 81 mg oral tablet 1 tab(s) orally once a day HOLD FIVE DAYS PRE-OP Active  Vitron-C 1 tab(s)  once a day AM Active  finasteride 5 mg oral tablet 1 tab(s) orally once a day AM. TAKE AM OF SURGERY Active  hydrochlorothiazide 25 mg oral tablet 1 tab(s) orally once a day AM Active  acetaminophen 325 mg oral tablet 3 tab(s) orally  every 6 hours Active  Effexor XR 75 mg oral capsule, extended release 1 cap(s) orally once a day AM Active   EKG:   EKG NSR    Codeine: N/V  Morphine: Other    Impression 81 year old male with no prior cardiac history who presented with dizziness and was noted to have wide complex tachycardia  on his electrocardiogram in the emergency room.  Reverted back to  sinus rhythm with IV amiodarone.  He had right shoulder pain with the episode.  He denies syncope or presyncope previously.  He has ruled out for myocardial infarction.  Electrocardiogram since the event has remained in sinus rhythm.  Electrolytes were within normal limits. etiology of arrhythmia is unclear but may be related to coronary disease.  He is quite active despite his age.  Echocardiogram is pending but we'll need to assess his coronary anatomy to guide further therapy with regard to  further medical and/or device therapy for his ventricular tachycardia.  Should he have significant coronary disease, intervention may be appropriate.    Plan 1.  Continue his IV amiodarone until further assessment of his coronary anatomy and ventricular function 2.  Proceed with echocardiogram as planned 3.  Proceed left cardiac catheterization evaluate coronary anatomy.  Risk and benefits were explained to the patient and he agrees to proceed 4.  Further recommendations after cardiac catheterization and echocardiogram are completed   Electronic Signatures: Dalia Heading (MD)  (Signed 16-Apr-13 09:46)  Authored: General Aspect/Present Illness, History and Physical Exam, Review of System, Past Medical History, Home Medications, EKG , Allergies, Impression/Plan   Last Updated: 16-Apr-13 09:46 by Dalia Heading (MD)

## 2014-12-11 NOTE — Discharge Summary (Signed)
PATIENT NAME:  Terry Johnson, Terry Johnson MR#:  [id number] DATE OF BIRTH:  1921-05-03  DATE OF ADMISSION:  12/02/2011 DATE OF DISCHARGE:  12/05/2011  DISCHARGE DIAGNOSES:  1. Acute ventricular tachycardia with near syncope.  2. Hypertension.  3. Hyperlipidemia.  4. Gastroesophageal reflux disease.  5. B12 deficiency.   DISCHARGE MEDICATIONS:  1. Aspirin 81 mg daily.  2. Effexor-XR 150 mg daily.  3. Lisinopril 40 mg one half daily.  4. Omeprazole 20 mg daily.  5. Simvastatin 40 mg at bedtime.  6. Xanax 0.5 mg at bedtime p.r.n.  7. Slow FE daily.  8. Folic acid daily.  9. Vitamin B12 daily.  10. Amiodarone 400 mg daily.   REASON FOR ADMISSION: 81 year old and male presents with symptomatic ventricular tachycardia and chest pain. Please see history and physical for history of present illness, past medical history, and physical exam.   HOSPITAL COURSE: The patient was admitted, ruled out for myocardial infarction. Electrolytes were stable. Creatinine improved slightly with hydration. He underwent heart catheterization which showed no obstructive disease. Echocardiogram showed mild MR with normal ejection fraction. He was loaded with IV amiodarone. His VT resolved. Really he had no more VT. He had some ankle stiffness treated with one dose of Solu-Medrol and physical therapy.   DISCHARGE INSTRUCTIONS: He will be getting home health physical therapy and will follow-up with Dr. Lady GaryFath.  OVERALL PROGNOSIS:  Guarded    ____________________________ Danella PentonMark F. Miller, MD mfm:[id number] D: 12/05/2011 07:28:03 ET T: 12/05/2011 14:10:48 ET JOB#: [id number]  cc: Danella PentonMark F. Miller, MD, <Dictator> MARK Sherlene ShamsF MILLER MD ELECTRONICALLY SIGNED 12/06/2011 7:48

## 2014-12-11 NOTE — H&P (Signed)
PATIENT NAME:  MUHAMMADALI, RIES MR#:  [id number] DATE OF BIRTH:  [birth year]  DATE OF ADMISSION:  12/02/2011  CHIEF COMPLAINT: Dizziness, unsteadiness, weakness, pain in the right shoulder and back of the neck.   HISTORY OF PRESENT ILLNESS: Barett Whidbee is an 81 year old gentleman with a history of hypertension and hyperlipidemia who presented initially to [facility] to see Dr. Hyacinth Meeker complaining of dizziness associated with unsteadiness, pain in the right shoulder and pain in the back of the neck. The patient states that his symptoms started this morning after he woke up. The patient had an EKG done which showed evidence for broad complex tachycardia, most likely V. tach. He was subsequently sent to the Emergency Room for further evaluation. The patient denies any fevers. No cough. No shortness of breath. No vomiting. No diarrhea. The patient received intravenous amiodarone in the ER, initial bolus of 150 followed by repeat bolus of 150 and converted to sinus rhythm with PVCs.   PAST MEDICAL HISTORY:  1. Hypertension.  2. Hyperlipidemia.  3. Benign prostatic hypertrophy.  4. Gastroesophageal reflux disease. 5. Left foot frostbite in [place] War II.  6. History of anemia.  7. B12 deficiency.    PAST SURGICAL HISTORY:  1. Lumbosacral laminectomy. 2. Right total hip replacement.  3. Left hip replacement. 4. Prostate surgery. 5. Hernia repairs.   CURRENT MEDICATIONS:  1. Aspirin 81 mg 1 tablet a day. 2. Effexor-XR 150 mg capsule once a day.  3. Lisinopril 40 mg once a day  4. Omeprazole 20 mg once a day.  5. Simvastatin 40 mg at bedtime.  6. Xanax 0.5 mg at bedtime as needed.  7. Slow Fe extended-release 47.5 mg 1 tab once a day.  8. Folic acid 1 mg a day.  9. PreserVision 1 capsule a day.  10. Vitamin B12.  11. Allegra 1 tsp twice a day.   ALLERGIES: He is allergic to codeine, phosphate, and morphine.  REVIEW OF SYSTEMS: The patient denies any fevers, no cough, no shortness of  breath, no vomiting, no diarrhea. His appetite has been good. Bowels have been somewhat constipated. Denies any abdominal pain. No headaches. No difficulty in urination.   SOCIAL HISTORY: He is an ex-smoker but quit in 1996. Used to smoke over a pack of cigarettes a day for about 40 to 50 years. Denies any consumption of alcohol. His wife passed away in 03-18-10. He has three children   PHYSICAL EXAMINATION:   GENERAL: The patient was not in distress.   HEENT: Normocephalic, atraumatic. Oropharynx normal.   NECK: No JVD.   HEART: S1, S2. No murmurs or gallops.   ABDOMEN: Soft, nontender.   EXTREMITIES: No edema.   NEUROLOGIC: Alert and oriented x3. No obvious focal deficits.   LABORATORY, DIAGNOSTIC, AND RADIOLOGICAL DATA: BNP 1056. CK 109. MB 2.0. Glucose 106, BUN 27, creatinine 1.92, sodium 140, potassium 4.1, chloride 105, CO2 25, calcium 9, AST 27, total protein 7.1, albumin 3.8. Digoxin level less than 0.06. Magnesium 1.9. Phosphorus 4.6. TSH 1.97. Troponin less than 0.02. WBC count 10.6, hemoglobin 11.2, hematocrit 35.5, platelets 294. PT 13.6. INR 1.   EKG showed irregular rhythm with runs of broad complex tachycardia consistent with V. tach and nonspecific intraventricular block.   IMPRESSION:  1. Ventricular tachycardia.  2. History of hypertension.  3. History of hyperlipidemia.  4. Chronic anemia.  5. History of gastroesophageal reflux disease.  PLAN: At this time the etiology of V tach appears unclear. The patient does not  have any significant electrolyte imbalances. Will admit the patient to CCU and continue IV amiodarone. Continue his home medications including lisinopril, simvastatin, and proton pump inhibitors. Monitor renal function closely. Will also consult Cardiology for further evaluation. Obtain echocardiogram.  ____________________________ Barbette ReichmannVishwanath Johnjoseph Rolfe, MD vh:drc D: 12/02/2011 19:11:15 ET T: 12/03/2011 06:26:59 ET JOB#: [id number]  cc: Barbette ReichmannVishwanath  Julianny Milstein, MD, <Dictator> Barbette ReichmannVISHWANATH Herberth Deharo MD ELECTRONICALLY SIGNED 12/09/2011 13:08

## 2014-12-11 NOTE — H&P (Signed)
PATIENT NAME:  Terry Johnson, Terry Johnson MR#:  [id number] DATE OF BIRTH:  [birth year]  DATE OF ADMISSION:  01/04/2012  PRIMARY CARE PHYSICIAN: Terry PunchesMark Miller, MD  CHIEF COMPLAINT: Chest pain.   HISTORY OF PRESENT ILLNESS: Terry Johnson is an 81 year old pleasant Caucasian male who presented to the emergency department with history of chest pain that started at 3 o'clock  p.m. located across the chest from the midsternum to the left side described as dull pressure. The severity was 3 to 4 on a scale of 10. It lasted a few hours and then gradually subsided by the time he came to the emergency department. He denies having any associated shortness of breath. No palpitations. No dizziness. No syncope or near syncope. It is worth mentioning that the patient was admitted last month, on 12/02/2011, when he had ventricular tachycardia and near syncope. He was placed on amiodarone. He had cardiac catheterization and that showed no significant obstructive coronary disease, and his echocardiogram showed normal ejection fraction.   REVIEW OF SYSTEMS: CONSTITUTIONAL: Denies any fever. No chills. No night sweats. No fatigue. EYES: No blurring of vision. No double vision. ENT: No hearing impairment. No sore throat. No dysphagia. CARDIOVASCULAR: Reports the chest pain as stated above. Denies any shortness of breath. No edema. No syncope. No palpitations. RESPIRATORY: No cough. No sputum production. He reports mild cough every now and then but nothing new. No fever. No sputum production. No shortness of breath. No hemoptysis. GASTROINTESTINAL: No abdominal pain. No vomiting or diarrhea. GENITOURINARY: No dysuria. No frequency of urination. MUSCULOSKELETAL: No joint pain or swelling. No muscular pain or swelling. INTEGUMENTARY: No skin rash. No ulcers. NEUROLOGY: No focal weakness. No seizure activity. No headache. PSYCHIATRY: Denies anxiety. No depression. ENDOCRINE: No polyuria or polydipsia. No heat or cold intolerance.   PAST MEDICAL  HISTORY:  1. In April of last month, he had ventricular tachycardia, maintained on amiodarone. 2. Systemic hypertension.  3. Hyperlipidemia.  4. Chronic kidney disease, stage III, although now it is worse at stage IV.  5. Gastroesophageal reflux disease. 6. Diverticulosis. 7. Benign prostatic hypertrophy. 8. Anemia. 9. Vitamin B12 deficiency. 10. Osteoarthritis.   PAST SURGICAL HISTORY:  1. Bilateral hip replacements. 2. Prostatic surgery. 3. Cataract surgery. 4. Hernia repair. 5. Lumbosacral laminectomy x4.   SOCIAL HABITS: Ex-chronic smoker. He quit a long time ago, in 1975. No history of alcoholism or other drug abuse.   SOCIAL HISTORY: The patient is widowed and lives at home alone.   FAMILY HISTORY: Positive for hypertension.  ADMISSION MEDICATIONS:  1. Lisinopril 20 mg once a day. 2. Omeprazole 40 mg a day. 3. Simvastatin 40 mg at night. 4. Folic acid 1 mg a day.  5. Venlafaxine XR 75 mg once a day. 6. Amiodarone 400 mg once a day. 7. Biotin 1000 mcg once a day.  8. Ferrous sulfate or iron sulfate 325 mg once a day. 9. Vitamin B12 1000 mcg once a day. 10. Aspirin enteric-coated 81 mg a day.   ALLERGIES: Codeine causing nausea and vomiting, and morphine.   PHYSICAL EXAMINATION:   VITAL SIGNS: Blood pressure 128/68, respiratory rate 20, pulse 70, temperature 98.7, and oxygen saturation 98%.   GENERAL APPEARANCE: Elderly male lying in bed in no acute distress.   HEAD AND NECK EXAMINATION: No pallor. No icterus. No cyanosis.   ENT: Hearing was normal. Nasal mucosa, lips, and tongue were normal. He is edentulous and he has dentures. Eye examination revealed normal eyelids and conjunctivae. Pupils are about 4 mm.  Difficult to elicit reactivity to light.   NECK: Supple. Trachea at midline. No thyromegaly. No cervical lymphadenopathy. No masses.   HEART: Normal S1 and S2. No S3 or S4. No murmur. No gallop. No carotid bruits.   LUNGS: Normal breathing pattern without  use of accessory muscles. No rales. No wheezing. He has fine crackles at the bases bilaterally.   ABDOMEN: Soft without tenderness. No hepatosplenomegaly. No masses. No hernias.   SKIN: No ulcers. No subcutaneous nodules.   MUSCULOSKELETAL: No joint swelling. No clubbing.   NEUROLOGIC: Cranial nerves II through XII are intact. No focal motor deficit.   LABS/STUDIES: Chest x-ray showed heart size normal. No consolidation. No effusion.   EKG showed normal sinus rhythm at rate of 74 per minute, Q waves in V1 and V2 consistent with possible old anteroseptal myocardial infarction, otherwise unremarkable EKG.  B-type natriuretic peptide (BNP) was elevated at 608. Glucose 106, BUN 27, creatinine 2.4. His baseline creatinine is ranging between 1.6 to 2.1 going to his records in April and early May of this year. Serum sodium 143 and potassium 4.9. Estimated GFR 23. Magnesium is normal at 2. Liver function tests were normal. Total CPK 104 and troponin less than 0.02. CBC showed white count of 8000, hemoglobin 9.3, and hematocrit 27. His baseline hemoglobin is 9. Platelet count 308.   ASSESSMENT:  1. Chest pain. Admit for observation and monitoring. The patient had recent cardiac catheterization a month ago and that showed no obstructive coronary artery disease.  2. Recent finding of ventricular tachycardia maintained on amiodarone.  3. Systemic hypertension.  4. Hyperlipidemia.  5. Chronic kidney disease, stage IV. 6. Gastroesophageal reflux disease. 7. Anemia.  8. Iron deficiency and B12 deficiency. 9. Osteoarthritis.   PLAN: Admit to the medical floor on telemetry for monitoring for any recurrence of ventricular tachycardia. Follow up on cardiac enzymes every 8 hours. I do not know at this point the etiology of his chest pain. Continue home medications as listed above. I would like to add that the patient has a Living Will and according to the son copy was provided and should be available in our  records in this hospital. The patient had given the power of attorney to make decisions to his son, [name]. His code status is DO NOT RESUSCITATE, as confirmed by his son.   TIME SPENT EVALUATING THE PATIENT:  More than 55 minutes. ____________________________ [place]. Rudene Re, MD amd:slb D: 01/04/2012 01:08:59 ET T: 01/04/2012 09:01:01 ET JOB#: [id number]  cc: [place]. Rudene Re, MD, <Dictator> Danella Penton, MD Karolee Ohs Dala Dock MD ELECTRONICALLY SIGNED 01/05/2012 0:37

## 2015-01-02 ENCOUNTER — Inpatient Hospital Stay: Payer: Medicare Other

## 2015-01-02 ENCOUNTER — Emergency Department: Payer: Medicare Other

## 2015-01-02 ENCOUNTER — Encounter: Payer: Self-pay | Admitting: Emergency Medicine

## 2015-01-02 ENCOUNTER — Inpatient Hospital Stay
Admission: EM | Admit: 2015-01-02 | Discharge: 2015-01-07 | DRG: 871 | Disposition: A | Discharge status: Skilled Nursing Facility | Payer: Medicare Other | Attending: Internal Medicine | Admitting: Internal Medicine

## 2015-01-02 DIAGNOSIS — Z87891 Personal history of nicotine dependence: Secondary | ICD-10-CM

## 2015-01-02 DIAGNOSIS — R531 Weakness: Secondary | ICD-10-CM | POA: Diagnosis present

## 2015-01-02 DIAGNOSIS — I10 Essential (primary) hypertension: Secondary | ICD-10-CM | POA: Diagnosis present

## 2015-01-02 DIAGNOSIS — K219 Gastro-esophageal reflux disease without esophagitis: Secondary | ICD-10-CM | POA: Diagnosis present

## 2015-01-02 DIAGNOSIS — F0390 Unspecified dementia without behavioral disturbance: Secondary | ICD-10-CM | POA: Diagnosis present

## 2015-01-02 DIAGNOSIS — R05 Cough: Secondary | ICD-10-CM

## 2015-01-02 DIAGNOSIS — F329 Major depressive disorder, single episode, unspecified: Secondary | ICD-10-CM | POA: Diagnosis present

## 2015-01-02 DIAGNOSIS — L03221 Cellulitis of neck: Secondary | ICD-10-CM | POA: Diagnosis present

## 2015-01-02 DIAGNOSIS — Z79899 Other long term (current) drug therapy: Secondary | ICD-10-CM | POA: Diagnosis not present

## 2015-01-02 DIAGNOSIS — W19XXXA Unspecified fall, initial encounter: Secondary | ICD-10-CM | POA: Diagnosis present

## 2015-01-02 DIAGNOSIS — G9349 Other encephalopathy: Secondary | ICD-10-CM | POA: Diagnosis present

## 2015-01-02 DIAGNOSIS — Z79891 Long term (current) use of opiate analgesic: Secondary | ICD-10-CM | POA: Diagnosis not present

## 2015-01-02 DIAGNOSIS — Z8614 Personal history of Methicillin resistant Staphylococcus aureus infection: Secondary | ICD-10-CM | POA: Diagnosis not present

## 2015-01-02 DIAGNOSIS — L0291 Cutaneous abscess, unspecified: Secondary | ICD-10-CM | POA: Diagnosis not present

## 2015-01-02 DIAGNOSIS — F419 Anxiety disorder, unspecified: Secondary | ICD-10-CM | POA: Diagnosis present

## 2015-01-02 DIAGNOSIS — N289 Disorder of kidney and ureter, unspecified: Secondary | ICD-10-CM | POA: Diagnosis present

## 2015-01-02 DIAGNOSIS — Z66 Do not resuscitate: Secondary | ICD-10-CM | POA: Diagnosis present

## 2015-01-02 DIAGNOSIS — Z9109 Other allergy status, other than to drugs and biological substances: Secondary | ICD-10-CM | POA: Diagnosis not present

## 2015-01-02 DIAGNOSIS — G8929 Other chronic pain: Secondary | ICD-10-CM | POA: Diagnosis present

## 2015-01-02 DIAGNOSIS — Z885 Allergy status to narcotic agent status: Secondary | ICD-10-CM

## 2015-01-02 DIAGNOSIS — F32A Depression, unspecified: Secondary | ICD-10-CM | POA: Diagnosis present

## 2015-01-02 DIAGNOSIS — R059 Cough, unspecified: Secondary | ICD-10-CM

## 2015-01-02 DIAGNOSIS — A419 Sepsis, unspecified organism: Secondary | ICD-10-CM | POA: Diagnosis present

## 2015-01-02 DIAGNOSIS — R4182 Altered mental status, unspecified: Secondary | ICD-10-CM

## 2015-01-02 DIAGNOSIS — R221 Localized swelling, mass and lump, neck: Secondary | ICD-10-CM | POA: Diagnosis present

## 2015-01-02 HISTORY — DX: Anemia, unspecified: D64.9

## 2015-01-02 HISTORY — DX: Pneumonia, unspecified organism: J18.9

## 2015-01-02 HISTORY — DX: Depression, unspecified: F32.A

## 2015-01-02 HISTORY — DX: Gastro-esophageal reflux disease without esophagitis: K21.9

## 2015-01-02 HISTORY — DX: Other chronic pain: G89.29

## 2015-01-02 HISTORY — DX: Essential (primary) hypertension: I10

## 2015-01-02 HISTORY — DX: Disorder of kidney and ureter, unspecified: N28.9

## 2015-01-02 HISTORY — DX: Major depressive disorder, single episode, unspecified: F32.9

## 2015-01-02 HISTORY — DX: Unspecified dementia, unspecified severity, without behavioral disturbance, psychotic disturbance, mood disturbance, and anxiety: F03.90

## 2015-01-02 HISTORY — DX: Anxiety disorder, unspecified: F41.9

## 2015-01-02 LAB — COMPREHENSIVE METABOLIC PANEL
ALT: 11 U/L — ABNORMAL LOW (ref 17–63)
ANION GAP: 10 (ref 5–15)
AST: 17 U/L (ref 15–41)
Albumin: 3.9 g/dL (ref 3.5–5.0)
Alkaline Phosphatase: 88 U/L (ref 38–126)
BUN: 24 mg/dL — ABNORMAL HIGH (ref 6–20)
CHLORIDE: 100 mmol/L — AB (ref 101–111)
CO2: 28 mmol/L (ref 22–32)
CREATININE: 1.89 mg/dL — AB (ref 0.61–1.24)
Calcium: 8.9 mg/dL (ref 8.9–10.3)
GFR, EST AFRICAN AMERICAN: 34 mL/min — AB (ref >60)
GFR, EST NON AFRICAN AMERICAN: 29 mL/min — AB (ref >60)
GLUCOSE: 128 mg/dL — AB (ref 65–99)
Potassium: 4.5 mmol/L (ref 3.5–5.1)
SODIUM: 138 mmol/L (ref 135–145)
Total Bilirubin: 0.6 mg/dL (ref 0.3–1.2)
Total Protein: 7.2 g/dL (ref 6.5–8.1)

## 2015-01-02 LAB — CBC WITH DIFFERENTIAL/PLATELET
BASOS ABS: 0.2 10*3/uL — AB (ref 0–0.1)
BASOS PCT: 1 %
EOS ABS: 0.1 10*3/uL (ref 0–0.7)
EOS PCT: 1 %
HEMATOCRIT: 37.3 % — AB (ref 40.0–52.0)
Hemoglobin: 12.2 g/dL — ABNORMAL LOW (ref 13.0–18.0)
Lymphocytes Relative: 4 %
Lymphs Abs: 0.7 10*3/uL — ABNORMAL LOW (ref 1.0–3.6)
MCH: 31.9 pg (ref 26.0–34.0)
MCHC: 32.7 g/dL (ref 32.0–36.0)
MCV: 97.5 fL (ref 80.0–100.0)
MONO ABS: 1.7 10*3/uL — AB (ref 0.2–1.0)
Monocytes Relative: 10 %
NEUTROS ABS: 15.2 10*3/uL — AB (ref 1.4–6.5)
Neutrophils Relative %: 84 %
Platelets: 314 10*3/uL (ref 150–440)
RBC: 3.83 MIL/uL — ABNORMAL LOW (ref 4.40–5.90)
RDW: 15 % — ABNORMAL HIGH (ref 11.5–14.5)
WBC: 17.9 10*3/uL — ABNORMAL HIGH (ref 3.8–10.6)

## 2015-01-02 MED ORDER — ACETAMINOPHEN 325 MG PO TABS
650.0000 mg | ORAL_TABLET | Freq: Four times a day (QID) | ORAL | Status: DC | PRN
  Administered 2015-01-02 – 2015-01-03 (×2): 650 mg via ORAL
  Filled 2015-01-02 (×2): qty 2

## 2015-01-02 MED ORDER — SODIUM CHLORIDE 0.9 % IV SOLN
INTRAVENOUS | Status: DC
  Administered 2015-01-02 – 2015-01-06 (×6): via INTRAVENOUS

## 2015-01-02 MED ORDER — ACETAMINOPHEN 650 MG RE SUPP: 650.0000 mg | Freq: Four times a day (QID) | RECTAL | Status: DC | PRN

## 2015-01-02 MED ORDER — DONEPEZIL HCL 5 MG PO TABS
10.0000 mg | ORAL_TABLET | Freq: Every day | ORAL | Status: DC
  Administered 2015-01-02 – 2015-01-06 (×5): 10 mg via ORAL
  Filled 2015-01-02 (×5): qty 2

## 2015-01-02 MED ORDER — ONDANSETRON HCL 4 MG/2ML IJ SOLN
4.0000 mg | Freq: Four times a day (QID) | INTRAMUSCULAR | Status: DC | PRN
Start: 2015-01-02 — End: 2015-01-07

## 2015-01-02 MED ORDER — AMIODARONE HCL 200 MG PO TABS
200.0000 mg | ORAL_TABLET | Freq: Two times a day (BID) | ORAL | Status: DC
  Administered 2015-01-02 – 2015-01-07 (×10): 200 mg via ORAL
  Filled 2015-01-02 (×10): qty 1

## 2015-01-02 MED ORDER — VENLAFAXINE HCL ER 75 MG PO CP24
150.0000 mg | ORAL_CAPSULE | Freq: Every morning | ORAL | Status: DC
  Administered 2015-01-03 – 2015-01-07 (×5): 150 mg via ORAL
  Filled 2015-01-02 (×5): qty 2

## 2015-01-02 MED ORDER — PIPERACILLIN-TAZOBACTAM 3.375 G IVPB 30 MIN
3.3750 g | Freq: Once | INTRAVENOUS | Status: AC
  Administered 2015-01-03: 3.375 g via INTRAVENOUS
  Filled 2015-01-02: qty 50

## 2015-01-02 MED ORDER — VANCOMYCIN HCL IN DEXTROSE 1-5 GM/200ML-% IV SOLN
1000.0000 mg | Freq: Once | INTRAVENOUS | Status: DC
  Filled 2015-01-02: qty 200

## 2015-01-02 MED ORDER — HEPARIN SODIUM (PORCINE) 5000 UNIT/ML IJ SOLN
5000.0000 [IU] | Freq: Three times a day (TID) | INTRAMUSCULAR | Status: DC
  Administered 2015-01-02 – 2015-01-07 (×14): 5000 [IU] via SUBCUTANEOUS
  Filled 2015-01-02 (×14): qty 1

## 2015-01-02 MED ORDER — SODIUM CHLORIDE 0.9 % IV BOLUS (SEPSIS)
1000.0000 mL | Freq: Once | INTRAVENOUS | Status: AC
  Administered 2015-01-02: 1000 mL via INTRAVENOUS

## 2015-01-02 MED ORDER — VANCOMYCIN HCL IN DEXTROSE 1-5 GM/200ML-% IV SOLN
1000.0000 mg | Freq: Once | INTRAVENOUS | Status: AC
  Administered 2015-01-02: 1000 mg via INTRAVENOUS

## 2015-01-02 MED ORDER — HYDROCODONE-ACETAMINOPHEN 5-325 MG PO TABS
0.5000 | ORAL_TABLET | Freq: Three times a day (TID) | ORAL | Status: DC | PRN
  Administered 2015-01-03: 0.5 via ORAL
  Filled 2015-01-02: qty 1

## 2015-01-02 MED ORDER — OLANZAPINE 2.5 MG PO TABS
5.0000 mg | ORAL_TABLET | Freq: Every morning | ORAL | Status: DC
  Administered 2015-01-03 – 2015-01-07 (×5): 5 mg via ORAL
  Filled 2015-01-02 (×2): qty 1
  Filled 2015-01-02 (×2): qty 2
  Filled 2015-01-02: qty 1

## 2015-01-02 MED ORDER — HYDROXYZINE HCL 10 MG PO TABS
10.0000 mg | ORAL_TABLET | Freq: Every day | ORAL | Status: DC
  Administered 2015-01-02 – 2015-01-06 (×5): 10 mg via ORAL
  Filled 2015-01-02 (×6): qty 1

## 2015-01-02 MED ORDER — VANCOMYCIN HCL IN DEXTROSE 1-5 GM/200ML-% IV SOLN
INTRAVENOUS | Status: AC
  Filled 2015-01-02: qty 200

## 2015-01-02 MED ORDER — AMLODIPINE BESYLATE 10 MG PO TABS
10.0000 mg | ORAL_TABLET | Freq: Every day | ORAL | Status: DC
  Administered 2015-01-02 – 2015-01-06 (×5): 10 mg via ORAL
  Filled 2015-01-02 (×6): qty 1

## 2015-01-02 MED ORDER — SODIUM CHLORIDE 0.9 % IJ SOLN
3.0000 mL | Freq: Two times a day (BID) | INTRAMUSCULAR | Status: DC
  Administered 2015-01-02 – 2015-01-06 (×4): 3 mL via INTRAVENOUS

## 2015-01-02 MED ORDER — PANTOPRAZOLE SODIUM 40 MG PO TBEC
40.0000 mg | DELAYED_RELEASE_TABLET | Freq: Every morning | ORAL | Status: DC
  Administered 2015-01-03 – 2015-01-07 (×5): 40 mg via ORAL
  Filled 2015-01-02 (×5): qty 1

## 2015-01-02 MED ORDER — ONDANSETRON HCL 4 MG PO TABS: 4.0000 mg | ORAL_TABLET | Freq: Four times a day (QID) | ORAL | Status: DC | PRN

## 2015-01-02 MED ORDER — TRAZODONE HCL 50 MG PO TABS
25.0000 mg | ORAL_TABLET | Freq: Every day | ORAL | Status: DC
  Administered 2015-01-02 – 2015-01-05 (×4): 25 mg via ORAL
  Administered 2015-01-06: 50 mg via ORAL
  Filled 2015-01-02 (×5): qty 1

## 2015-01-02 MED ORDER — SENNOSIDES-DOCUSATE SODIUM 8.6-50 MG PO TABS: 1.0000 | ORAL_TABLET | Freq: Every evening | ORAL | Status: DC | PRN

## 2015-01-02 NOTE — ED Provider Notes (Signed)
[facility] Emergency Department Provider Note  ____________________________________________  Time seen: Approximately 7:34 PM  I have reviewed the triage vital signs and the nursing notes.   HISTORY  Chief Complaint Weakness; Altered Mental Status; and Fall    HPI Terry Johnson is a 81 y.o. male who developed weakness and difficulty with walking because he feels "weak" this evening. He fell because his legs "gave out". He hasn't been having fevers and chills. He has an infection over the back of his neck which was drained with a needle this morning by his doctor. He was placed on Bactrim. He denies headache or neck pain, except for the skin over the back of the neck. He denies cough. No abdominal pain. No numbness or weakness in arm or leg. No speech changes. He did not fall or hit his head, rather he became very weak and more or less slid to the floor while using his walker. Normally he is good strength with a walker, but he has been more fatigued for the last day.  Of note he does have a history of MRSA previously.  States achy pain over the back of the neck. Started yesterday. Prescribed Bactrim has not started.  Past Medical History  Diagnosis Date  . Dementia   . Chronic pain   . Anemia   . Hypertension   . GERD (gastroesophageal reflux disease)   . Depression   . Anxiety   . Pneumonitis   . Renal disorder     There are no active problems to display for this patient.   Past Surgical History  Procedure Laterality Date  . Lumbar laminectomy    . Hernia repair    . Cataract extraction    . Hip arthroplasty Right     with subsequent revision  . Hip arthroplasty Left     Current Outpatient Rx  Name  Route  Sig  Dispense  Refill  . acetaminophen (TYLENOL) 325 MG tablet   Oral   Take 325 mg by mouth 3 (three) times daily.         Marland Kitchen. acetaminophen (TYLENOL) 500 MG tablet   Oral   Take 1,000 mg by mouth 2 (two) times daily as needed for  mild pain or fever.         Marland Kitchen. amiodarone (PACERONE) 200 MG tablet   Oral   Take 200 mg by mouth 2 (two) times daily.         Marland Kitchen. amLODipine (NORVASC) 10 MG tablet   Oral   Take 10 mg by mouth at bedtime.         . camphor-menthol (SARNA) lotion   Topical   Apply 1 application topically 2 (two) times daily as needed for itching.         . donepezil (ARICEPT) 10 MG tablet   Oral   Take 10 mg by mouth at bedtime.         . ferrous sulfate 325 (65 FE) MG tablet   Oral   Take 325 mg by mouth 3 (three) times daily.         . folic acid (FOLVITE) 1 MG tablet   Oral   Take 1 mg by mouth every morning.         . gabapentin (NEURONTIN) 100 MG capsule   Oral   Take 100 mg by mouth every morning.         Marland Kitchen. HYDROcodone-acetaminophen (NORCO/VICODIN) 5-325 MG per tablet   Oral  Take 0.5 tablets by mouth every 8 (eight) hours as needed for moderate pain.         . hydrOXYzine (ATARAX/VISTARIL) 10 MG tablet   Oral   Take 10 mg by mouth at bedtime.         Marland Kitchen ipratropium (ATROVENT) 0.03 % nasal spray   Each Nare   Place 2 sprays into both nostrils 2 (two) times daily.         . magnesium hydroxide (MILK OF MAGNESIA) 400 MG/5ML suspension   Oral   Take 30 mLs by mouth daily as needed for mild constipation.         . Multiple Vitamins-Minerals (PRESERVISION AREDS PO)   Oral   Take 1 capsule by mouth 2 (two) times daily.         . mupirocin ointment (BACTROBAN) 2 %   Nasal   Place 1 application into the nose 3 (three) times daily.         Marland Kitchen neomycin-bacitracin-polymyxin (NEOSPORIN) ointment   Topical   Apply 1 application topically 2 (two) times daily. apply to eye         . OLANZapine (ZYPREXA) 5 MG tablet   Oral   Take 5 mg by mouth every morning.         . pantoprazole (PROTONIX) 40 MG tablet   Oral   Take 40 mg by mouth every morning.         . Pramoxine-Calamine (AVEENO ANTI-ITCH EX)   Apply externally   Apply 1 application topically  at bedtime.         . sulfamethoxazole-trimethoprim (BACTRIM DS,SEPTRA DS) 800-160 MG per tablet   Oral   Take 1 tablet by mouth every 12 (twelve) hours.         . traMADol (ULTRAM) 50 MG tablet   Oral   Take 50 mg by mouth every 12 (twelve) hours.         . traZODone (DESYREL) 50 MG tablet   Oral   Take 25 mg by mouth at bedtime.         . triamcinolone (KENALOG) 0.025 % cream   Topical   Apply 1 application topically every morning. Pt mixes with Cerave.  (1:1 mixture)         . venlafaxine XR (EFFEXOR-XR) 150 MG 24 hr capsule   Oral   Take 150 mg by mouth every morning.         . vitamin B-12 (CYANOCOBALAMIN) 1000 MCG tablet   Oral   Take 1,000 mcg by mouth every morning.           Allergies Codeine and Ms contin  Family History  Problem Relation Age of Onset  . Lung cancer Father   . Diabetes Father   . Arthritis/Rheumatoid Father     Social History History  Substance Use Topics  . Smoking status: Never Smoker   . Smokeless tobacco: Not on file  . Alcohol Use: Not on file    Review of Systems Constitutional: Fevers and chills Eyes: No visual changes. ENT: No sore throat. Cardiovascular: Denies chest pain. Respiratory: Denies shortness of breath. Occasional cough nonproductive. Gastrointestinal: No abdominal pain.  No nausea, no vomiting.  No diarrhea.  No constipation. Genitourinary: Negative for dysuria. Musculoskeletal: Negative for back pain. Skin: Negative for rash except over back of neck Neurological: Negative for headaches, focal weakness or numbness.  10-point ROS otherwise negative.  ____________________________________________   PHYSICAL EXAM:  VITAL SIGNS: ED Triage Vitals  Enc Vitals Group     BP 01/02/15 1810 173/66 mmHg     Pulse Rate 01/02/15 1810 77     Resp --      Temp 01/02/15 1810 100.2 F (37.9 C)     Temp Source 01/02/15 1810 Oral     SpO2 01/02/15 1810 94 %     Weight 01/02/15 1810 172 lb (78.019 kg)      Height 01/02/15 1810 6' (1.829 m)     Head Cir --      Peak Flow --      Pain Score --      Pain Loc --      Pain Edu? --      Excl. in GC? --     Constitutional: Alert and oriented to self. Fatigued and chronically frail-appearing Eyes: Conjunctivae are normal. PERRL. EOMI. Head: Atraumatic. Nose: No congestion/rhinnorhea. Mouth/Throat: Mucous membranes are dry .  Oropharynx non-erythematous. Neck: No stridor. There is obvious area of approximately 8 cm of erythema and induration over the midline posterior cervical spine. Cardiovascular: Normal rate, regular rhythm. Grossly normal heart sounds.  Good peripheral circulation. Respiratory: Normal respiratory effort.  No retractions. Dry crackle in bases bilaterally. No rales Gastrointestinal: Soft and nontender. No distention. No abdominal bruits. No CVA tenderness. Musculoskeletal: No lower extremity tenderness nor edema.  No joint effusions. Neurologic:  . Very hard of hearing. Slow in his speech. No gross focal neurologic deficits are appreciated. Speech is normal. No gait instability. Skin:  Skin is warm, dry and intact except as noted over the neck  Psychiatric: Mood and affect are slightly flat.  ____________________________________________   LABS (all labs ordered are listed, but only abnormal results are displayed)  Labs Reviewed  COMPREHENSIVE METABOLIC PANEL - Abnormal; Notable for the following:    Chloride 100 (*)    Glucose, Bld 128 (*)    BUN 24 (*)    Creatinine, Ser 1.89 (*)    ALT 11 (*)    GFR calc non Af Amer 29 (*)    GFR calc Af Amer 34 (*)    All other components within normal limits  CBC WITH DIFFERENTIAL/PLATELET - Abnormal; Notable for the following:    WBC 17.9 (*)    RBC 3.83 (*)    Hemoglobin 12.2 (*)    HCT 37.3 (*)    RDW 15.0 (*)    Neutro Abs 15.2 (*)    Lymphs Abs 0.7 (*)    Monocytes Absolute 1.7 (*)    Basophils Absolute 0.2 (*)    All other components within normal limits  CULTURE,  BLOOD (ROUTINE X 2)  CULTURE, BLOOD (ROUTINE X 2)  CBG MONITORING, ED   ____________________________________________  EKG Normal sinus rhythm rate 76 PR 202 QRS 122 QTc 481 no acute ischemic changes. 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  ____________________________________________  RADIOLOGY  Ultrasound soft tissue neck does not demonstrate a frank fluid collection, I discussed with Dr. Anne HahnWillis the hospitalist and we will order a CT soft tissue noncontrast of further evaluate and make sure there is no collection. ____________________________________________   PROCEDURES  Procedure(s) performed: None  Critical Care performed: No  ____________________________________________   INITIAL IMPRESSION / ASSESSMENT AND PLAN / ED COURSE  Pertinent labs & imaging results that were available during my care of the patient were reviewed by me and considered in my medical decision making (see chart for details).  Abscess versus cellulitis over the posterior cervical spine. There is no meningismus. There  is no headache. I am concerned the patient has developed increasing fatigue and weakness and has a low-grade temperature. He does have a history of MRSA. We will obtain ultrasound of the posterior neck to evaluate for drainable fluid collection. If there is a collection there, I anticipate general surgical consultation given its location. In addition, we will start the patient on vancomycin given his history of MRSA.  We will hydrate, obtain basic labs, and I anticipate admission given his worsening clinical course and reported weakness with obvious cellulitis versus abscess over the neck.  Patient remains stable. Based on his fever, obvious cellulitis, generalized weakness, and elevated white count we will admit him to the hospital for further evaluation of abscess and antibiotic therapy. He does not appear clinically well enough to return to his nursing facility at this  time ____________________________________________   FINAL CLINICAL IMPRESSION(S) / ED DIAGNOSES  Final diagnoses:  Abscess  Cellulitis, neck      Sharyn Creamer, MD 01/02/15 2108

## 2015-01-02 NOTE — ED Notes (Signed)
Pt brought in via ems from [facility] with abccess to back of neck.  Pt also had a fall today.  Pt confused, alert.  No obvious injury noted.  abcess not draining now. Area red and tender.  Fever noted today.

## 2015-01-02 NOTE — H&P (Signed)
[facility] [place] [facility]   PATIENT NAME: Terry Johnson    MR#:  [id number]  DATE OF BIRTH:  1920-10-07  DATE OF ADMISSION:  01/02/2015  PRIMARY CARE PHYSICIAN: No primary care provider on file.   REQUESTING/REFERRING PHYSICIAN: Quale  CHIEF COMPLAINT:   Chief Complaint  Patient presents with  . Weakness  . Altered Mental Status    per EMS "not acting right"  . Fall    fell while using walker    HISTORY OF PRESENT ILLNESS:  Terry SiadJames Olds  is a 81 y.o. male who presents with cellulitis of the neck. Patient lives in a nursing facility, and told his son, who is present for the interview the ED today, that he began having some pain in his cervical neck yesterday that got a lot worse today. His son states that he and his brother were visiting the patient yesterday and the patient did not mention this, but that today the patient said that he had the pain since yesterday. Patient's physicians at the facility tried to aspirate the lesion today, without much success. Patient in the ED was found to have a fever to 100.2 with an elevated white count. The son states that he feels the patient's fever was higher at the facility, but he does not know the exact measurement. The patient has confusion, and generalized weakness, had a fall at his facility just prior to the ED. The son states that his level of confusion and his weakness is atypical for his baseline. Hospitalists were called for severe cellulitis, and subsequent sepsis. Ultrasound of the lesion in the ED did not show an overt fluid collection, however noncontrast CT was ordered by ED physician to check for this as well, and to look at bony structure given the sensitivity of the region where the infection is. Patient is unable on interview to provide much coherent history due to underlying dementia and confusion from his current illness, though he is able to answer direct questions about review of  systems.  PAST MEDICAL HISTORY:   Past Medical History  Diagnosis Date  . Dementia   . Chronic pain   . Anemia   . Hypertension   . GERD (gastroesophageal reflux disease)   . Depression   . Anxiety   . Pneumonitis   . Renal disorder     PAST SURGICAL HISTORY:   Past Surgical History  Procedure Laterality Date  . Lumbar laminectomy    . Hernia repair    . Cataract extraction    . Hip arthroplasty Right     with subsequent revision  . Hip arthroplasty Left     SOCIAL HISTORY:   History  Substance Use Topics  . Smoking status: Former Games developermoker  . Smokeless tobacco: Not on file  . Alcohol Use: No    FAMILY HISTORY:   Family History  Problem Relation Age of Onset  . Lung cancer Father   . Diabetes Father   . Arthritis/Rheumatoid Father   . Lung cancer Brother     DRUG ALLERGIES:   Allergies  Allergen Reactions  . Codeine Other (See Comments)    Reaction:  Unknown  . Ms Contin [Morphine Sulfate Er Beads] Other (See Comments)    Reaction:  Unknown    MEDICATIONS AT HOME:   Prior to Admission medications   Medication Sig Start Date End Date Taking? Authorizing Provider  acetaminophen (TYLENOL) 325 MG tablet Take 325 mg by mouth 3 (three) times daily.  Yes Historical Provider, MD  acetaminophen (TYLENOL) 500 MG tablet Take 1,000 mg by mouth 2 (two) times daily as needed for mild pain or fever.   Yes Historical Provider, MD  amiodarone (PACERONE) 200 MG tablet Take 200 mg by mouth 2 (two) times daily.   Yes Historical Provider, MD  amLODipine (NORVASC) 10 MG tablet Take 10 mg by mouth at bedtime.   Yes Historical Provider, MD  camphor-menthol Wynelle Fanny(SARNA) lotion Apply 1 application topically 2 (two) times daily as needed for itching.   Yes Historical Provider, MD  donepezil (ARICEPT) 10 MG tablet Take 10 mg by mouth at bedtime.   Yes Historical Provider, MD  ferrous sulfate 325 (65 FE) MG tablet Take 325 mg by mouth 3 (three) times daily.   Yes Historical Provider,  MD  folic acid (FOLVITE) 1 MG tablet Take 1 mg by mouth every morning.   Yes Historical Provider, MD  gabapentin (NEURONTIN) 100 MG capsule Take 100 mg by mouth every morning.   Yes Historical Provider, MD  HYDROcodone-acetaminophen (NORCO/VICODIN) 5-325 MG per tablet Take 0.5 tablets by mouth every 8 (eight) hours as needed for moderate pain.   Yes Historical Provider, MD  hydrOXYzine (ATARAX/VISTARIL) 10 MG tablet Take 10 mg by mouth at bedtime.   Yes Historical Provider, MD  ipratropium (ATROVENT) 0.03 % nasal spray Place 2 sprays into both nostrils 2 (two) times daily.   Yes Historical Provider, MD  magnesium hydroxide (MILK OF MAGNESIA) 400 MG/5ML suspension Take 30 mLs by mouth daily as needed for mild constipation.   Yes Historical Provider, MD  Multiple Vitamins-Minerals (PRESERVISION AREDS PO) Take 1 capsule by mouth 2 (two) times daily.   Yes Historical Provider, MD  mupirocin ointment (BACTROBAN) 2 % Place 1 application into the nose 3 (three) times daily.   Yes Historical Provider, MD  neomycin-bacitracin-polymyxin (NEOSPORIN) ointment Apply 1 application topically 2 (two) times daily. apply to eye   Yes Historical Provider, MD  OLANZapine (ZYPREXA) 5 MG tablet Take 5 mg by mouth every morning.   Yes Historical Provider, MD  pantoprazole (PROTONIX) 40 MG tablet Take 40 mg by mouth every morning.   Yes Historical Provider, MD  Pramoxine-Calamine (AVEENO ANTI-ITCH EX) Apply 1 application topically at bedtime.   Yes Historical Provider, MD  sulfamethoxazole-trimethoprim (BACTRIM DS,SEPTRA DS) 800-160 MG per tablet Take 1 tablet by mouth every 12 (twelve) hours.   Yes Historical Provider, MD  traMADol (ULTRAM) 50 MG tablet Take 50 mg by mouth every 12 (twelve) hours.   Yes Historical Provider, MD  traZODone (DESYREL) 50 MG tablet Take 25 mg by mouth at bedtime.   Yes Historical Provider, MD  triamcinolone (KENALOG) 0.025 % cream Apply 1 application topically every morning. Pt mixes with  Cerave.  (1:1 mixture)   Yes Historical Provider, MD  venlafaxine XR (EFFEXOR-XR) 150 MG 24 hr capsule Take 150 mg by mouth every morning.   Yes Historical Provider, MD  vitamin B-12 (CYANOCOBALAMIN) 1000 MCG tablet Take 1,000 mcg by mouth every morning.   Yes Historical Provider, MD    REVIEW OF SYSTEMS:  Review of Systems  Constitutional: Positive for fever. Negative for chills, weight loss and malaise/fatigue.  HENT: Negative for ear pain, hearing loss and tinnitus.   Eyes: Negative for blurred vision, double vision, pain and redness.  Respiratory: Negative for cough, hemoptysis and shortness of breath.   Cardiovascular: Negative for chest pain, palpitations, orthopnea and leg swelling.  Gastrointestinal: Negative for nausea, vomiting, abdominal pain, diarrhea and constipation.  Genitourinary: Negative for dysuria, frequency and hematuria.  Musculoskeletal: Positive for back pain (chronic back pain) and neck pain. Negative for joint pain.  Skin: Negative for rash.       Area of erythema and warmth with induration over his cervical neck without overt fluctuance.  Neurological: Positive for weakness. Negative for dizziness, tremors and focal weakness.  Endo/Heme/Allergies: Negative for polydipsia. Does not bruise/bleed easily.  Psychiatric/Behavioral: Negative for depression. The patient is not nervous/anxious and does not have insomnia.        Chronic underlying dementia     VITAL SIGNS:   Filed Vitals:   01/02/15 1810  BP: 173/66  Pulse: 77  Temp: 100.2 F (37.9 C)  TempSrc: Oral  Height: 6' (1.829 m)  Weight: 78.019 kg (172 lb)  SpO2: 94%   Wt Readings from Last 3 Encounters:  01/02/15 78.019 kg (172 lb)    PHYSICAL EXAMINATION:  Physical Exam  Constitutional: He appears well-developed and well-nourished. No distress.  HENT:  Head: Normocephalic and atraumatic.  Mouth/Throat: Oropharynx is clear and moist.  Eyes: Conjunctivae and EOM are normal. Pupils are equal,  round, and reactive to light. No scleral icterus.  Neck: Normal range of motion. Neck supple. No JVD present. No thyromegaly present.  Cardiovascular: Normal rate, regular rhythm and intact distal pulses.  Exam reveals no gallop and no friction rub.   No murmur heard. Respiratory: Effort normal and breath sounds normal. No respiratory distress. He has no wheezes. He has no rales.  GI: Soft. Bowel sounds are normal. He exhibits no distension. There is no tenderness.  Musculoskeletal: Normal range of motion. He exhibits no edema.  No arthritis, no gout  Lymphadenopathy:    He has no cervical adenopathy.  Neurological: He is alert. No cranial nerve deficit.  Oriented to person, not to place or circumstance, no dysarthria, no aphasia  Skin: Skin is warm and dry. No rash noted. There is erythema (cervical neck).  Area of induration without overt fluctuance over cervical neck region  Psychiatric:  Acute encephalopathy due to his current illness superimposed on underlying dementia. Patient is not agitated, he is cooperative.    LABORATORY PANEL:   CBC  Recent Labs Lab 01/02/15 1826  WBC 17.9*  HGB 12.2*  HCT 37.3*  PLT 314   ------------------------------------------------------------------------------------------------------------------  Chemistries   Recent Labs Lab 01/02/15 1806  NA 138  K 4.5  CL 100*  CO2 28  GLUCOSE 128*  BUN 24*  CREATININE 1.89*  CALCIUM 8.9  AST 17  ALT 11*  ALKPHOS 88  BILITOT 0.6   ------------------------------------------------------------------------------------------------------------------  Cardiac Enzymes No results for input(s): TROPONINI in the last 168 hours. ------------------------------------------------------------------------------------------------------------------  RADIOLOGY:  US Soft Tissue Head/neck  01/02/2015   CLINICAL DATA:  Evaluate region of erythema and induration along the posterior superior aspect of the neck.  Initial encounter.  EXAM: ULTRASOUND OF HEAD/NECK SOFT TISSUES  TECHNIQUE: Ultrasound examination of the head and neck soft tissues was performed in the area of clinical concern.  COMPARISON:  CT of the cervical spine performed 01/07/2014  FINDINGS: There is a small hypoechoic structure at the site of the patient's symptoms, along the posterior superior neck, measuring 0.8 x 0.4 x 0.7 cm, with an echogenic center. The appearance is most compatible with a lymph node. Would correlate for evidence of underlying infection. There is no evidence of abscess. The lymph node remains normal in size and architecture, though seen in a somewhat unusual location.  Surrounding soft tissues are unremarkable in  appearance. No significant hyperemia is seen.  IMPRESSION: 0.7 cm lymph node noted along the posterior superior neck, without evidence of hyperemia or abscess. Would correlate for evidence of underlying infection. The lymph node remains normal in size and architecture, though seen in a somewhat unusual location. If symptoms persist, biopsy could be considered as deemed clinically appropriate.   Electronically Signed   By: Roanna Raider M.D.   On: 01/02/2015 20:45    EKG:   Orders placed or performed during the hospital encounter of 01/02/15  . ED EKG  . ED EKG    IMPRESSION AND PLAN:  Principal Problem:   Sepsis - borderline fever here, with reported overt fever outside, with elevated white count. Secondary to his cellulitis. Hemodynamically stable, we will check a lactic acid, patient has been started on antibiotics, and blood cultures ordered. Treatment of underlying problems as below. Active Problems:   Cellulitis of neck - without evidence of overt abscess of this point, however noncontrast CT (patient has chronic renal disease) pending as ordered by ED physician. Antibiotics as above, if any overt abscesses seen we will consult surgery for incision and drainage.   HTN (hypertension) - chronic problem,  with mild elevation here, continue home meds to control this.   Dementia - chronic problem, continue home medications   GERD (gastroesophageal reflux disease) - chronic problem, home dose PPI while here   Depression - chronic stable problem, continue home meds.   Anxiety- chronic stable problem, continue home meds.  All the records are reviewed and case discussed with ED provider. Management plans discussed with the patient and/or family.  DVT PROPHYLAXIS: SubQ heparin  ADMISSION STATUS: Inpatient  CODE STATUS: DO NOT RESUSCITATE  TOTAL TIME TAKING CARE OF THIS PATIENT: 50 minutes.    Marica Trentham FIELDING 01/02/2015, 9:45 PM  [employer] [facility]  Office  [phone]  CC: Primary care physician; No primary care provider on file.

## 2015-01-02 NOTE — ED Notes (Signed)
[facility] Per EMS , pt has not been "acting right". Per EMS pt has large abscess on back of neck and had it drained yesterday; questionable MRSA. Pt fell while using walker. Pt is alert and oriented.

## 2015-01-03 LAB — CBC
HCT: 33.9 % — ABNORMAL LOW (ref 40.0–52.0)
Hemoglobin: 10.9 g/dL — ABNORMAL LOW (ref 13.0–18.0)
MCH: 31.2 pg (ref 26.0–34.0)
MCHC: 32.2 g/dL (ref 32.0–36.0)
MCV: 97.1 fL (ref 80.0–100.0)
PLATELETS: 258 10*3/uL (ref 150–440)
RBC: 3.49 MIL/uL — AB (ref 4.40–5.90)
RDW: 14.4 % (ref 11.5–14.5)
WBC: 20.4 10*3/uL — ABNORMAL HIGH (ref 3.8–10.6)

## 2015-01-03 LAB — BASIC METABOLIC PANEL
Anion gap: 8 (ref 5–15)
BUN: 23 mg/dL — AB (ref 6–20)
CHLORIDE: 101 mmol/L (ref 101–111)
CO2: 26 mmol/L (ref 22–32)
CREATININE: 1.63 mg/dL — AB (ref 0.61–1.24)
Calcium: 8.5 mg/dL — ABNORMAL LOW (ref 8.9–10.3)
GFR calc Af Amer: 41 mL/min — ABNORMAL LOW (ref >60)
GFR calc non Af Amer: 35 mL/min — ABNORMAL LOW (ref >60)
Glucose, Bld: 122 mg/dL — ABNORMAL HIGH (ref 65–99)
Potassium: 4 mmol/L (ref 3.5–5.1)
Sodium: 135 mmol/L (ref 135–145)

## 2015-01-03 LAB — LACTIC ACID, PLASMA: LACTIC ACID, VENOUS: 1.4 mmol/L (ref 0.5–2.0)

## 2015-01-03 LAB — MRSA PCR SCREENING: MRSA BY PCR: NEGATIVE — AB

## 2015-01-03 MED ORDER — GABAPENTIN 100 MG PO CAPS
100.0000 mg | ORAL_CAPSULE | Freq: Every morning | ORAL | Status: DC
  Administered 2015-01-04 – 2015-01-07 (×4): 100 mg via ORAL
  Filled 2015-01-03 (×5): qty 1

## 2015-01-03 MED ORDER — VANCOMYCIN HCL 10 G IV SOLR
1250.0000 mg | INTRAVENOUS | Status: DC
  Administered 2015-01-03 – 2015-01-04 (×2): 1250 mg via INTRAVENOUS
  Filled 2015-01-03 (×3): qty 1250

## 2015-01-03 MED ORDER — PIPERACILLIN-TAZOBACTAM 3.375 G IVPB
3.3750 g | Freq: Three times a day (TID) | INTRAVENOUS | Status: DC
  Administered 2015-01-03 – 2015-01-07 (×13): 3.375 g via INTRAVENOUS
  Filled 2015-01-03 (×17): qty 50

## 2015-01-03 MED ORDER — FERROUS SULFATE 325 (65 FE) MG PO TABS
325.0000 mg | ORAL_TABLET | Freq: Three times a day (TID) | ORAL | Status: DC
  Administered 2015-01-03 – 2015-01-07 (×9): 325 mg via ORAL
  Filled 2015-01-03 (×9): qty 1

## 2015-01-03 MED ORDER — FOLIC ACID 1 MG PO TABS
1.0000 mg | ORAL_TABLET | Freq: Every day | ORAL | Status: DC
  Administered 2015-01-03 – 2015-01-07 (×5): 1 mg via ORAL
  Filled 2015-01-03 (×5): qty 1

## 2015-01-03 NOTE — Progress Notes (Signed)
MD paged for diet order since CT scan & US had been completed. Also orders for a MRSA screening since pt has a history of MRSA with no date, contact precautions placed. Shirley FriarAlexis Miller, RN

## 2015-01-03 NOTE — Progress Notes (Signed)
Patient ID: Terry Johnson, male   DOB: [birth year], 81 y.o.   MRN: [id number] [facility] [place] [facility]   PATIENT NAME: Collie SiadJames Johnson    MR#:  [id number]  DATE OF BIRTH:  [birth year]  SUBJECTIVE:  Came in with confusion and weakness. Was found to have cellulitis of the eck(back)  REVIEW OF SYSTEMS:    Review of Systems  Constitutional: Positive for fever. Negative for chills and weight loss.  HENT: Negative for ear discharge, ear pain and nosebleeds.   Eyes: Negative for blurred vision, pain and discharge.  Respiratory: Negative for sputum production, shortness of breath, wheezing and stridor.   Cardiovascular: Negative for chest pain, palpitations, orthopnea and PND.  Gastrointestinal: Negative for nausea, vomiting, abdominal pain and diarrhea.  Genitourinary: Negative for urgency and frequency.  Musculoskeletal: Positive for neck pain. Negative for back pain and joint pain.  Skin: Positive for rash.  Neurological: Negative for sensory change, speech change, focal weakness and weakness.  Psychiatric/Behavioral: Negative for depression and hallucinations. The patient is not nervous/anxious.     Tolerating Diet: yes Tolerating PT: not done yet  DRUG ALLERGIES:   Allergies  Allergen Reactions  . Codeine Other (See Comments)    Reaction:  Unknown  . Ms Contin [Morphine Sulfate Er Beads] Other (See Comments)    Reaction:  Unknown    VITALS:  Blood pressure 127/49, pulse 73, temperature 98.4 F (36.9 C), temperature source Oral, resp. rate 20, height 6' (1.829 m), weight 73.8 kg (162 lb 11.2 oz), SpO2 94 %.  PHYSICAL EXAMINATION:   Physical Exam  Constitutional: He is well-developed, well-nourished, and in no distress. No distress.  HENT:  Head: Normocephalic and atraumatic.  Nose: Nose normal.  Mouth/Throat: Oropharynx is clear and moist.  Eyes: Conjunctivae and EOM are normal. Pupils are equal, round, and reactive to light.  Neck:  Normal range of motion.  Cardiovascular: Normal rate, regular rhythm and normal heart sounds.  Exam reveals no friction rub.   Pulmonary/Chest: Effort normal and breath sounds normal. No respiratory distress. He has no wheezes. He has no rales.  Abdominal: Soft. Bowel sounds are normal. He exhibits no distension. There is no tenderness.  Musculoskeletal: Normal range of motion. He exhibits no edema.  Lymphadenopathy:    He has no cervical adenopathy.  Neurological: He is alert. He has normal reflexes. No cranial nerve deficit. Coordination normal.  Skin: Skin is warm. Rash noted. He is not diaphoretic. There is erythema (over the ape of the neck).  Psychiatric: Mood and affect normal.   LABORATORY PANEL:   CBC  Recent Labs Lab 01/03/15 0443  WBC 20.4*  HGB 10.9*  HCT 33.9*  PLT 258   ------------------------------------------------------------------------------------------------------------------  Chemistries   Recent Labs Lab 01/02/15 1806 01/03/15 0443  NA 138 135  K 4.5 4.0  CL 100* 101  CO2 28 26  GLUCOSE 128* 122*  BUN 24* 23*  CREATININE 1.89* 1.63*  CALCIUM 8.9 8.5*  AST 17  --   ALT 11*  --   ALKPHOS 88  --   BILITOT 0.6  --    ------------------------------------------------------------------------------------------------------------------  Cardiac Enzymes No results for input(s): TROPONINI in the last 168 hours. ------------------------------------------------------------------------------------------------------------------  RADIOLOGY:  Ct Soft Tissue Neck Wo Contrast  01/02/2015   CLINICAL DATA:  Weakness, pain in back of neck. Abscess on posterior neck post drainage at nursing home earlier this day. Fever yesterday.  EXAM: CT NECK WITHOUT CONTRAST  TECHNIQUE: Multidetector CT imaging of the  neck was performed following the standard protocol without intravenous contrast.  COMPARISON:  Soft tissue ultrasound earlier this day. CT cervical spine 01/07/2014   FINDINGS: There is diffuse posterior skin thickening about the neck with associated subcutaneous edema. In the region of soft tissue edema is a 7 x 9 mm nodule in the subcutaneous tissues. This may correspond to the lymph node as seen on prior ultrasound. There is no drainable fluid collection to suggest abscess. No soft tissue air.  Incidental note of soft tissue nodules in the left greater than right parotid gland that appears similar to prior exam and may reflect intra parotid lymph nodes. There is no definite cervical lymphadenopathy. Moderate to dense atherosclerosis of the carotid vasculature. Heterogeneous appearance of the thyroid gland is similar to prior CT. The upper esophagus is patulous and contains intraluminal debris. Emphysematous change at the lung apices with biapical pleural parenchymal scarring.  IMPRESSION: 1. Diffuse posterior skin thickening and subcutaneous soft tissue edema, may reflect cellulitis in the appropriate clinical setting. There is a small subcutaneous soft tissue nodule measuring less than 1 cm that likely corresponds to the lymph node seen on prior ultrasound. No drainable fluid collection or abscess. 2. Patulous esophagus containing intraluminal debris, suggestive of reflux or slow transit.   Electronically Signed   By: Rubye OaksMelanie  Ehinger M.D.   On: 01/02/2015 23:18   [place] Soft Tissue Head/neck  01/02/2015   CLINICAL DATA:  Evaluate region of erythema and induration along the posterior superior aspect of the neck. Initial encounter.  EXAM: ULTRASOUND OF HEAD/NECK SOFT TISSUES  TECHNIQUE: Ultrasound examination of the head and neck soft tissues was performed in the area of clinical concern.  COMPARISON:  CT of the cervical spine performed 01/07/2014  FINDINGS: There is a small hypoechoic structure at the site of the patient's symptoms, along the posterior superior neck, measuring 0.8 x 0.4 x 0.7 cm, with an echogenic center. The appearance is most compatible with a lymph node.  Would correlate for evidence of underlying infection. There is no evidence of abscess. The lymph node remains normal in size and architecture, though seen in a somewhat unusual location.  Surrounding soft tissues are unremarkable in appearance. No significant hyperemia is seen.  IMPRESSION: 0.7 cm lymph node noted along the posterior superior neck, without evidence of hyperemia or abscess. Would correlate for evidence of underlying infection. The lymph node remains normal in size and architecture, though seen in a somewhat unusual location. If symptoms persist, biopsy could be considered as deemed clinically appropriate.   Electronically Signed   By: Roanna RaiderJeffery  Chang M.D.   On: 01/02/2015 20:45     ASSESSMENT AND PLAN:   * Sepsis - borderline fever here, with reported overt fever outside, with elevated white count Secondary to his neck cellulitis.  -Hemodynamically stable -IVF and IV broad spectrum abxs  * Cellulitis of neck - without evidence of overt abscess of this point -CT soft tissue neck no abscess, s/o of cellulitis -IV vanc and zosyn -f/u BC and wbc count - Antibiotics as above, if any overt abscesses seen we will consult surgery for incision and drainage.  *HTN (hypertension) - chronic problem, with mild elevation here, continue home meds to control this.  * Dementia - chronic problem, continue home medications  *GERD (gastroesophageal reflux disease) - chronic problem, home dose PPI   *Depression - chronic stable problem, continue home meds.  * Anxiety- chronic stable problem, continue home meds.    All the records are reviewed and  case discussed with Care Management/Social Workerr. Management plans discussed with the patient, family and they are in agreement.  CODE STATUS: DNR DVT Prophylaxis: heparin  TOTAL TIME TAKING CARE OF THIS PATIENT: 30 minutes.   POSSIBLE D/C IN 2-3 DAYS, DEPENDING ON CLINICAL CONDITION.   Brixton Schnapp M.D on 01/03/2015 at 1:55 PM  Between  7am to 6pm - Pager - [phone]  After 6pm go to [email or web address] - password [email or web address] [facility]  [facility]  Office  [phone]  CC: Primary care physician; No primary care provider on file.

## 2015-01-03 NOTE — Evaluation (Signed)
Physical Therapy Evaluation Patient Details Name: Terry Johnson MRN: [id number] DOB: 01/09/1921 Today's Date: 01/03/2015   History of Present Illness  presented to ED secondary to neck pain, progressive weakness/AMS and fall prior to admission; admitted for acute hospitalization secondary to sepsis related to severe cellulitis.    Clinical Impression  Upon evaluation, patient alert and oriented to self and location only; inconsistently follows simple commands (50-75%).  Generally confused and easily distracted/forgettable.  Visible area of redness/irritation to posterior neck; patient very tender to touch.  Unable to follow formal commands for MMT/ROM assessment; demonstrates generalized weakness and deconditioning (at least 3-/5).  Difficulty coordinating, sequencing movement; poor ability to respond to external perturbation or balance disturbances.  Currently requiring max assist +1-2 for bed mobility; mod/max assist +1 for unsupported sitting balance (R lateral lean); max assist +2 for sit/stand with bilat HHA.  Unsafe/unable to attempt additional mobility.  Notably fatigued with above activity. Would benefit from skilled PT to address above deficits and promote optimal return to PLOF; recommend transition to STR upon discharge from acute hospitalization. Patient/family aware of recommendations and in agreement with plan.     Follow Up Recommendations SNF    Equipment Recommendations       Recommendations for Other Services       Precautions / Restrictions Precautions Precautions: Fall Precaution Comments: Contact isolation Restrictions Weight Bearing Restrictions: No      Mobility  Bed Mobility Overal bed mobility: Needs Assistance;+2 for physical assistance Bed Mobility: Supine to Sit;Sit to Supine     Supine to sit: Mod assist;Max assist Sit to supine: Max assist;+2 for physical assistance      Transfers Overall transfer level: Needs assistance Equipment used: 2  person hand held assist Transfers: Sit to/from Stand Sit to Stand: Max assist;+2 physical assistance         General transfer comment: very heavy R lateral lean; poor/absent balance  Ambulation/Gait             General Gait Details: unsafe/unable at this time  Stairs            Wheelchair Mobility    Modified Rankin (Stroke Patients Only)       Balance Overall balance assessment: Needs assistance Sitting-balance support: Bilateral upper extremity supported Sitting balance-Leahy Scale: Poor Sitting balance - Comments: Unsupported sitting balance, mod/max assist for R lateral lean; absent balance/righting reactions Postural control: Right lateral lean Standing balance support: Bilateral upper extremity supported Standing balance-Leahy Scale: Zero                               Pertinent Vitals/Pain Pain Assessment: No/denies pain    Home Living Family/patient expects to be discharged to:: [facility]isted [facility]               Home Equipment: Dan HumphreysWalker - 2 wheels;Cane - single point      Prior Function Level of Independence: Independent with assistive device(s)         Comments: Ambulates throughout facility with RW vs SPC as needed; mod indep for ADLs.  [facility]  Son endorses single fall (immed prior to admission) without additional encounters in previous six months.     Hand Dominance        Extremity/Trunk Assessment   Upper Extremity Assessment: Generalized weakness (difficulty following commands for formal MMT; grossly WFL for basic mobility, strength at least 3-/5)  Lower Extremity Assessment: Generalized weakness (difficulty following commands for formal MMT; ROM grossly WFL for transfers, strength at least 3-/5.  Globally weak and deconditioned)      Cervical / Trunk Assessment: Kyphotic  Communication   Communication: No difficulties;HOH  Cognition Arousal/Alertness:  Awake/alert Behavior During Therapy: WFL for tasks assessed/performed Overall Cognitive Status: Impaired/Different from baseline Area of Impairment: Orientation;Memory;Following commands;Safety/judgement;Awareness;Problem solving Orientation Level: Disoriented to;Time;Situation;Place   Memory: Decreased short-term memory Following Commands: Follows one step commands inconsistently Safety/Judgement: Decreased awareness of safety;Decreased awareness of deficits   Problem Solving: Slow processing;Decreased initiation;Difficulty sequencing;Requires verbal cues;Requires tactile cues      General Comments      Exercises Other Exercises Other Exercises: Additional sitting balance, mod/max assist for R lateral lean; sit/stand with bilat HHA, max assist +2.  Absent/no righting reactions.  Patient fatigues quickly and requires return to supine.  Unable/unsafe to attempt additional mobility at this time. (8 minutes)      Assessment/Plan    PT Assessment Patient needs continued PT services  PT Diagnosis Difficulty walking;Generalized weakness   PT Problem List Decreased strength;Decreased range of motion;Decreased activity tolerance;Decreased balance;Decreased mobility;Decreased knowledge of precautions;Decreased safety awareness;Decreased knowledge of use of DME;Decreased cognition;Decreased coordination  PT Treatment Interventions DME instruction;Gait training;Functional mobility training;Therapeutic activities;Therapeutic exercise;Patient/family education;Cognitive remediation;Balance training   PT Goals (Current goals can be found in the Care Plan section) Acute Rehab PT Goals Patient Stated Goal: patient unable to verbalize; [facility] at discharge PT Goal Formulation: With patient/family Time For Goal Achievement: 01/17/15 Potential to Achieve Goals: Fair Additional Goals Additional Goal #1: Unsupported sitting balance, sup, for  safety/indep with functional activities and ADLs. Additional Goal #2: Assess and establish goals for transfers, gait as appropriate.    Frequency Min 2X/week   Barriers to discharge Decreased caregiver support      Co-evaluation               End of Session Equipment Utilized During Treatment: Gait belt Activity Tolerance: Patient limited by fatigue Patient left: in bed;with call bell/phone within reach;with bed alarm set;with family/visitor present Nurse Communication: Mobility status (discharge recs)         Time: [id number] PT Time Calculation (min) (ACUTE ONLY): 24 min   Charges:   PT Evaluation $Initial PT Evaluation Tier I: 1 Procedure PT Treatments $Therapeutic Activity: 8-22 mins   PT G Codes:       Onda Kattner H. Manson PasseyBrown, PT, DPT 01/03/2015, 3:48 PM [phone]

## 2015-01-03 NOTE — Progress Notes (Signed)
ANTIBIOTIC CONSULT NOTE - INITIAL  Pharmacy Consult for vancomycin and Zosyn dosing Indication: Cellulitis/sepsis  Allergies  Allergen Reactions  . Codeine Other (See Comments)    Reaction:  Unknown  . Ms Contin [Morphine Sulfate Er Beads] Other (See Comments)    Reaction:  Unknown    Patient Measurements: Height: 6' (182.9 cm) Weight: 163 lb 9.6 oz (74.208 kg) IBW/kg (Calculated) : 77.6 Adjusted Body Weight: 74.2kg  Vital Signs: Temp: 102.6 F (39.2 C) (05/16 2253) Temp Source: Oral (05/16 2253) BP: 157/62 mmHg (05/16 2253) Pulse Rate: 97 (05/16 2253) Intake/Output from previous day: 05/16 0701 - 05/17 0700 In: 3 [I.V.:3] Out: 300 [Urine:300] Intake/Output from this shift: Total I/O In: 3 [I.V.:3] Out: 300 [Urine:300]  Labs:  Recent Labs  01/02/15 1806 01/02/15 1826  WBC  --  17.9*  HGB  --  12.2*  PLT  --  314  CREATININE 1.89*  --    Estimated Creatinine Clearance: 26.7 mL/min (by C-G formula based on Cr of 1.89). No results for input(Johnson): VANCOTROUGH, VANCOPEAK, VANCORANDOM, GENTTROUGH, GENTPEAK, GENTRANDOM, TOBRATROUGH, TOBRAPEAK, TOBRARND, AMIKACINPEAK, AMIKACINTROU, AMIKACIN in the last 72 hours.   Microbiology: No results found for this or any previous visit (from the past 720 hour(Johnson)).  Medical History: Past Medical History  Diagnosis Date  . Dementia   . Chronic pain   . Anemia   . Hypertension   . GERD (gastroesophageal reflux disease)   . Depression   . Anxiety   . Pneumonitis   . Renal disorder     Medications:   Assessment:   Goal of Therapy:  Vanc trough 15-20.  Plan:  Blood cx pending. kei 0.027 hr-1  T1/2 26 houts TBW 74.2kg  IBW 77.6kg  DW 74.2kg  Vd 52L 1 gram given in ED. Vancomycin 1250 mg IV q 36 hours to start at 0800, 5/17. Trough ordered for 0700, 5/20 before 4th dose.   Terry Johnson 01/03/2015,12:16 AM

## 2015-01-04 LAB — CBC
HCT: 37.3 % — ABNORMAL LOW (ref 40.0–52.0)
HEMOGLOBIN: 12.1 g/dL — AB (ref 13.0–18.0)
MCH: 31.4 pg (ref 26.0–34.0)
MCHC: 32.5 g/dL (ref 32.0–36.0)
MCV: 96.6 fL (ref 80.0–100.0)
Platelets: 285 10*3/uL (ref 150–440)
RBC: 3.86 MIL/uL — ABNORMAL LOW (ref 4.40–5.90)
RDW: 14.6 % — AB (ref 11.5–14.5)
WBC: 20.9 10*3/uL — ABNORMAL HIGH (ref 3.8–10.6)

## 2015-01-04 LAB — BASIC METABOLIC PANEL
ANION GAP: 9 (ref 5–15)
BUN: 19 mg/dL (ref 6–20)
CHLORIDE: 101 mmol/L (ref 101–111)
CO2: 26 mmol/L (ref 22–32)
CREATININE: 1.41 mg/dL — AB (ref 0.61–1.24)
Calcium: 8.7 mg/dL — ABNORMAL LOW (ref 8.9–10.3)
GFR calc Af Amer: 49 mL/min — ABNORMAL LOW (ref >60)
GFR calc non Af Amer: 42 mL/min — ABNORMAL LOW (ref >60)
Glucose, Bld: 120 mg/dL — ABNORMAL HIGH (ref 65–99)
Potassium: 3.9 mmol/L (ref 3.5–5.1)
Sodium: 136 mmol/L (ref 135–145)

## 2015-01-04 NOTE — Clinical Social Work Note (Signed)
CSW spoke to [facility].  Per facility pt will be able to return to their facility and have his care needs met.  Facility was able to send a care coordinator to the hospital to evaluate the pt.  Per facility pt's son has already been notified that pt can return to ALF upon DC.  CSW will continue to follow to DC.

## 2015-01-04 NOTE — Progress Notes (Signed)
Pt c/o burning while urinating, Dr. Allena KatzPatel made aware and RN given verbal order for urine culture.

## 2015-01-04 NOTE — Care Management (Signed)
[facility]  Per Trinna PostAlex from  Facility,  patient was able to ambulate on his own.  Patient is requiring maximum assist at present and informed that physical therapy is recommending skilled nursing.  Alex says that facility can provide physical therapy.  Discussed the difference of intensity of therapy that can be delivered in assisted  living vs skilled nursing.  Trinna Postlex states that she does not know if [facility] can [facility].  Discussed that at present, patient is maximum assist.

## 2015-01-04 NOTE — Progress Notes (Signed)
ANTIBIOTIC CONSULT NOTE - FOLLOW UP  Pharmacy Consult for Vancomycin/Zosyn Indication: Cellulitis  Allergies  Allergen Reactions  . Codeine Other (See Comments)    Reaction:  Unknown  . Ms Contin [Morphine Sulfate Er Beads] Other (See Comments)    Reaction:  Unknown    Patient Measurements: Height: 6' (182.9 cm) Weight: 161 lb 8 oz (73.256 kg) IBW/kg (Calculated) : 77.6  Vital Signs: Temp: 97.3 F (36.3 C) (05/18 0426) BP: 158/57 mmHg (05/18 1127) Pulse Rate: 85 (05/18 1127) Intake/Output from previous day: 05/17 0701 - 05/18 0700 In: 0  Out: 500 [Urine:500] Intake/Output from this shift: Total I/O In: 600 [P.O.:600] Out: 200 [Urine:200]  Labs:  Recent Labs  01/02/15 1806 01/02/15 1826 01/03/15 0443 01/04/15 0806  WBC  --  17.9* 20.4* 20.9*  HGB  --  12.2* 10.9* 12.1*  PLT  --  314 258 285  CREATININE 1.89*  --  1.63* 1.41*   Estimated Creatinine Clearance: 35.4 mL/min (by C-G formula based on Cr of 1.41). No results for input(s): VANCOTROUGH, VANCOPEAK, VANCORANDOM, GENTTROUGH, GENTPEAK, GENTRANDOM, TOBRATROUGH, TOBRAPEAK, TOBRARND, AMIKACINPEAK, AMIKACINTROU, AMIKACIN in the last 72 hours.   Microbiology: Recent Results (from the past 720 hour(s))  Culture, blood (routine x 2)     Status: None (Preliminary result)   Collection Time: 01/02/15  8:46 PM  Result Value Ref Range Status   Specimen Description BLOOD  Final   Special Requests NONE  Final   Culture NO GROWTH 2 DAYS  Final   Report Status PENDING  Incomplete  MRSA PCR Screening     Status: Abnormal   Collection Time: 01/02/15 11:33 PM  Result Value Ref Range Status   MRSA by PCR NEGATIVE (A) NEGATIVE Final    Comment:        The GeneXpert MRSA Assay (FDA approved for NASAL specimens only), is one component of a comprehensive MRSA colonization surveillance program. It is not intended to diagnose MRSA infection nor to guide or monitor treatment for MRSA infections.     Anti-infectives     Start     Dose/Rate Route Frequency Ordered Stop   01/03/15 0800  vancomycin (VANCOCIN) 1,250 mg in sodium chloride 0.9 % 250 mL IVPB     1,250 mg 166.7 mL/hr over 90 Minutes Intravenous Every 36 hours 01/03/15 0015     01/03/15 0600  piperacillin-tazobactam (ZOSYN) IVPB 3.375 g     3.375 g 12.5 mL/hr over 240 Minutes Intravenous 3 times per day 01/03/15 0015     01/02/15 2245  piperacillin-tazobactam (ZOSYN) IVPB 3.375 g     3.375 g 100 mL/hr over 30 Minutes Intravenous  Once 01/02/15 2243 01/03/15 0036   01/02/15 2245  vancomycin (VANCOCIN) IVPB 1000 mg/200 mL premix  Status:  Discontinued     1,000 mg 200 mL/hr over 60 Minutes Intravenous  Once 01/02/15 2243 01/03/15 0010   01/02/15 2030  vancomycin (VANCOCIN) IVPB 1000 mg/200 mL premix     1,000 mg 200 mL/hr over 60 Minutes Intravenous  Once 01/02/15 2019 01/02/15 2207      Assessment: Patient is a 81 yo male receiving empiric antibiotic therapy with Zosyn and Vancomycin.    SCr: 1.41, est CrCl~35.4 mL/min, ke: 0.034, t1/2: 20.4 h, Vd: 51.3L  Goal of Therapy:  Vancomycin trough level 10-15 mcg/ml  Plan:  Continue Vancomycin 1250 mg IV q36h.  Renal function is improving.  If further improvement, consider transitioning patient to 1250 mg IV q24h.  Trough ordered tentatively for 5/20 at 0700.  Measure antibiotic drug levels at steady state Follow up culture results  Continue Zosyn 3.375 gm IV q8h per EI protocol.   Clarisa Schoolsrystal Hicks Feick, PharmD Clinical Pharmacist 01/04/2015

## 2015-01-04 NOTE — Progress Notes (Signed)
Patient ID: Terry Johnson, male   DOB: September 29, 1920, 81 y.o.   MRN: [id number] [facility] - [place] at [facility]   PATIENT NAME: Terry Johnson    MR#:  [id number]  DATE OF BIRTH:  27-Sep-1920  SUBJECTIVE:  Came in with confusion and weakness. Was found to have cellulitis of the neck(back) -more alert now. Neck pain persists  REVIEW OF SYSTEMS:    Review of Systems  Constitutional: Negative for fever, chills and weight loss.  HENT: Negative for ear discharge, ear pain and nosebleeds.   Eyes: Negative for blurred vision, pain and discharge.  Respiratory: Negative for sputum production, shortness of breath, wheezing and stridor.   Cardiovascular: Negative for chest pain, palpitations, orthopnea and PND.  Gastrointestinal: Negative for nausea, vomiting, abdominal pain and diarrhea.  Genitourinary: Negative for urgency and frequency.  Musculoskeletal: Positive for back pain and neck pain. Negative for joint pain.  Neurological: Positive for weakness. Negative for sensory change, speech change and focal weakness.  Psychiatric/Behavioral: Negative for depression. The patient is not nervous/anxious.   All other systems reviewed and are negative.   Tolerating Diet: yes Tolerating PT: recommends rehab   DRUG ALLERGIES:   Allergies  Allergen Reactions  . Codeine Other (See Comments)    Reaction:  Unknown  . Ms Contin [Morphine Sulfate Er Beads] Other (See Comments)    Reaction:  Unknown    VITALS:  Blood pressure 158/57, pulse 85, temperature 97.3 F (36.3 C), temperature source Oral, resp. rate 20, height 6' (1.829 m), weight 73.256 kg (161 lb 8 oz), SpO2 100 %.  PHYSICAL EXAMINATION:   Physical Exam  Constitutional: He is well-developed, well-nourished, and in no distress. No distress.  HENT:  Head: Normocephalic and atraumatic.  Nose: Nose normal.  Mouth/Throat: Oropharynx is clear and moist.  Eyes: Conjunctivae and EOM are normal. Pupils are  equal, round, and reactive to light.  Neck: Normal range of motion.  Cardiovascular: Normal rate, regular rhythm and normal heart sounds.  Exam reveals no friction rub.   Pulmonary/Chest: Effort normal and breath sounds normal. No respiratory distress. He has no wheezes. He has no rales.  Abdominal: Soft. Bowel sounds are normal. He exhibits no distension. There is no tenderness.  Musculoskeletal: Normal range of motion. He exhibits no edema.  Lymphadenopathy:    He has no cervical adenopathy.  Neurological: He is alert. He has normal reflexes. No cranial nerve deficit. Coordination normal.  Skin: Skin is warm. Rash noted. He is not diaphoretic. There is erythema (over the ape of the neck).  Psychiatric: Mood and affect normal.   LABORATORY PANEL:   CBC  Recent Labs Lab 01/04/15 0806  WBC 20.9*  HGB 12.1*  HCT 37.3*  PLT 285   ------------------------------------------------------------------------------------------------------------------  Chemistries   Recent Labs Lab 01/02/15 1806  01/04/15 0806  NA 138  < > 136  K 4.5  < > 3.9  CL 100*  < > 101  CO2 28  < > 26  GLUCOSE 128*  < > 120*  BUN 24*  < > 19  CREATININE 1.89*  < > 1.41*  CALCIUM 8.9  < > 8.7*  AST 17  --   --   ALT 11*  --   --   ALKPHOS 88  --   --   BILITOT 0.6  --   --   < > = values in this interval not displayed.  RADIOLOGY:  Ct Soft Tissue Neck Wo Contrast  01/02/2015  CLINICAL DATA:  Weakness, pain in back of neck. Abscess on posterior neck post drainage at nursing home earlier this day. Fever yesterday.  EXAM: CT NECK WITHOUT CONTRAST  TECHNIQUE: Multidetector CT imaging of the neck was performed following the standard protocol without intravenous contrast.  COMPARISON:  Soft tissue ultrasound earlier this day. CT cervical spine 01/07/2014  FINDINGS: There is diffuse posterior skin thickening about the neck with associated subcutaneous edema. In the region of soft tissue edema is a 7 x 9 mm nodule  in the subcutaneous tissues. This may correspond to the lymph node as seen on prior ultrasound. There is no drainable fluid collection to suggest abscess. No soft tissue air.  Incidental note of soft tissue nodules in the left greater than right parotid gland that appears similar to prior exam and may reflect intra parotid lymph nodes. There is no definite cervical lymphadenopathy. Moderate to dense atherosclerosis of the carotid vasculature. Heterogeneous appearance of the thyroid gland is similar to prior CT. The upper esophagus is patulous and contains intraluminal debris. Emphysematous change at the lung apices with biapical pleural parenchymal scarring.  IMPRESSION: 1. Diffuse posterior skin thickening and subcutaneous soft tissue edema, may reflect cellulitis in the appropriate clinical setting. There is a small subcutaneous soft tissue nodule measuring less than 1 cm that likely corresponds to the lymph node seen on prior ultrasound. No drainable fluid collection or abscess. 2. Patulous esophagus containing intraluminal debris, suggestive of reflux or slow transit.   Electronically Signed   By: Rubye OaksMelanie  Ehinger M.D.   On: 01/02/2015 23:18   [place] Soft Tissue Head/neck  01/02/2015   CLINICAL DATA:  Evaluate region of erythema and induration along the posterior superior aspect of the neck. Initial encounter.  EXAM: ULTRASOUND OF HEAD/NECK SOFT TISSUES  TECHNIQUE: Ultrasound examination of the head and neck soft tissues was performed in the area of clinical concern.  COMPARISON:  CT of the cervical spine performed 01/07/2014  FINDINGS: There is a small hypoechoic structure at the site of the patient's symptoms, along the posterior superior neck, measuring 0.8 x 0.4 x 0.7 cm, with an echogenic center. The appearance is most compatible with a lymph node. Would correlate for evidence of underlying infection. There is no evidence of abscess. The lymph node remains normal in size and architecture, though seen in a  somewhat unusual location.  Surrounding soft tissues are unremarkable in appearance. No significant hyperemia is seen.  IMPRESSION: 0.7 cm lymph node noted along the posterior superior neck, without evidence of hyperemia or abscess. Would correlate for evidence of underlying infection. The lymph node remains normal in size and architecture, though seen in a somewhat unusual location. If symptoms persist, biopsy could be considered as deemed clinically appropriate.   Electronically Signed   By: Roanna RaiderJeffery  Chang M.D.   On: 01/02/2015 20:45     ASSESSMENT AND PLAN:   * Sepsis - Secondary to his neck cellulitis.  -Hemodynamically stable -IVF and IV zosyn and Vanc  * Cellulitis of neck - without evidence of overt abscess of this point -CT soft tissue neck no abscess, s/o of cellulitis -IV vanc and zosyn -WBC 18--.20K-->20K - surgical consult today -BC negative  *HTN (hypertension) - chronic problem, with mild elevation here, continue home meds to control this.  * Dementia - chronic problem, continue home medications  *GERD (gastroesophageal reflux disease) - chronic problem, home dose PPI   *Depression - chronic stable problem, continue home meds.  * Anxiety- chronic stable problem, continue home  meds.  *Gen weakness PT recommends rehab  case discussed with Care Management/Social Worker. Management plans discussed with the patient, family and they are in agreement.  CODE STATUS: DNR DVT Prophylaxis: heparin  TOTAL TIME TAKING CARE OF THIS PATIENT: 30 minutes.   POSSIBLE D/C IN 2-3 DAYS, DEPENDING ON CLINICAL CONDITION.   Catlin Doria M.D on 01/04/2015 at 11:52 AM  Between 7am to 6pm - Pager - [phone]  After 6pm go to [email or web address] - password [email or web address] [facility]  [facility]  Office  [phone]  CC: Primary care physician; No primary care provider on file.

## 2015-01-04 NOTE — Consult Note (Signed)
General Surgery Consult Note  CC: Posterior neck swelling, purulence, induration, leukocytosis  HPI: 81 yo who presented on 5/16 with weakness and AMS.  Reported that he had been having pain in his posterior neck that had been worsening.  Attempted I and D at SNF without much success.  Found to be febrile in ER.  Prior to presentation became confused, week.  U/S showed no obvious abscess.  Per notes, patient is more alert since being admitted but has persistent leukocytosis.  Patient is poor historian.  Does say that his neck is better but still having significant pain.  Past Medical History  Diagnosis Date  . Dementia   . Chronic pain   . Anemia   . Hypertension   . GERD (gastroesophageal reflux disease)   . Depression   . Anxiety   . Pneumonitis   . Renal disorder    Scheduled Meds: . amiodarone  200 mg Oral BID  . amLODipine  10 mg Oral QHS  . donepezil  10 mg Oral QHS  . ferrous sulfate  325 mg Oral TID WC  . folic acid  1 mg Oral Daily  . gabapentin  100 mg Oral q morning - 10a  . heparin  5,000 Units Subcutaneous 3 times per day  . hydrOXYzine  10 mg Oral QHS  . OLANZapine  5 mg Oral q morning - 10a  . pantoprazole  40 mg Oral q morning - 10a  . piperacillin-tazobactam (ZOSYN)  IV  3.375 g Intravenous 3 times per day  . sodium chloride  3 mL Intravenous Q12H  . traZODone  25 mg Oral QHS  . vancomycin  1,250 mg Intravenous Q36H  . venlafaxine XR  150 mg Oral q morning - 10a   Continuous Infusions: . sodium chloride 75 mL/hr at 01/03/15 1332   PRN Meds:.acetaminophen **OR** acetaminophen, HYDROcodone-acetaminophen, ondansetron **OR** ondansetron (ZOFRAN) IV, senna-docusate   Family History  Problem Relation Age of Onset  . Lung cancer Father   . Diabetes Father   . Arthritis/Rheumatoid Father   . Lung cancer Brother     History   Social History  . Marital Status: Widowed    Spouse Name: N/A  . Number of Children: N/A  . Years of Education: N/A   Occupational  History  . Not on file.   Social History Main Topics  . Smoking status: Former Games developermoker  . Smokeless tobacco: Not on file  . Alcohol Use: No  . Drug Use: No  . Sexual Activity: Not on file   Other Topics Concern  . Not on file   Social History Narrative  . No narrative on file   ROS: Unable to obtain reliable ROS  Blood pressure 158/57, pulse 85, temperature 97.3 F (36.3 C), temperature source Oral, resp. rate 20, height 6' (1.829 m), weight 73.256 kg (161 lb 8 oz), SpO2 100 %.  GEN: NAD/interactive HEAD: Normocephalic atraumatic NECK: Posterior neck with approx 10 x 5 cm induration, some purulence from punctate area at center, no obvious fluctuance or cavity with probing FACE: no obvious facial trauma, normal external nose and ear CV: RRR, no MRG RESP: moving air well, clear bilaterally ABD: soft, min tender, nondistended EXT: moves all extremity well, strength 5/5 NEURO: CN II-XII grossly intact, sensation intact  Labs: Current - Reviewed, noted positives include WBC 20.9 (20.4 yesterday but Hgb up to 12.1 from 10.9) Cr 1.41  Imaging - CT neck - soft tissue swelling without drainable abscess  A/P 81 yo M  with neck abscess, mostly cellulitis.  Some drainage but no obvious cavity.  Will potentially make NPO after midnight for exploration in OR but I am unconvinced that there is any undrained abscess.

## 2015-01-04 NOTE — Progress Notes (Signed)
Physical Therapy Treatment Patient Details Name: Terry EmeryJames C Sundt MRN: [id number] DOB: 1920/11/15 Today's Date: 01/04/2015    History of Present Illness presented to ED secondary to neck pain, progressive weakness/AMS and fall prior to admission; admitted for acute hospitalization secondary to sepsis related to severe cellulitis.      PT Comments    Patient with marked improvement in mental clarity and overall functional performance this date.  Able to meet sitting balance goal established upon eval; additional goals updated/added to reflect progress. Able to initiate short-distance gait with RW, min/mod assist +1-2 for safety.  Will continue to progress as able. If functional ability continues to improve as medical condition clears, will be okay for return to ALF with HHPT.   Follow Up Recommendations  SNF;Home health PT (anticipate progression to home with HHPT as medical condition improves)     Equipment Recommendations       Recommendations for Other Services       Precautions / Restrictions Precautions Precautions: Fall Restrictions Weight Bearing Restrictions: No    Mobility  Bed Mobility Overal bed mobility: Needs Assistance Bed Mobility: Supine to Sit     Supine to sit: Mod assist     General bed mobility comments: improved active effort and participation this date  Transfers Overall transfer level: Needs assistance Equipment used: 2 person hand held assist Transfers: Sit to/from Stand Sit to Stand: Max assist;+2 physical assistance         General transfer comment: cuing for hand placement; improved task initiation and overall performance  Ambulation/Gait Ambulation/Gait assistance: Min assist;Mod assist;+2 physical assistance Ambulation Distance (Feet): 45 Feet Assistive device: Rolling walker (2 wheeled)       General Gait Details: reciprocal gait pattern; forward trunk flexion with downward gaze.  Min cuing for walker management.  Decreased  dynamic balance and functional activity tolerance.   Stairs            Wheelchair Mobility    Modified Rankin (Stroke Patients Only)       Balance                                    Cognition Arousal/Alertness: Awake/alert Behavior During Therapy: WFL for tasks assessed/performed Overall Cognitive Status: History of cognitive impairments - at baseline                      Exercises Other Exercises Other Exercises: Seated UE/LE therex, 1x15, AROM for muscular strength/endurance with functional activities: ankle pumps, LAQs, marching; shoulder flex/ext, rowing; cervical rotation.  Significant improvement in cognitive status, functional performance this date.    General Comments        Pertinent Vitals/Pain Pain Assessment:  (Unrated, "better than yesterday")    Home Living                      Prior Function            PT Goals (current goals can now be found in the care plan section) Acute Rehab PT Goals Patient Stated Goal: patient unable to verbalize; [facility] PT Goal Formulation: With patient/family Time For Goal Achievement: 01/17/15 Potential to Achieve Goals: Fair Additional Goals Additional Goal #1: Basic transfers with LRAD, sup, for access to all seating surfaces in home environment. Additional Goal #2: Gait >150' with LRAD, sup, for household  mobilization upon discharge. Progress towards PT goals: Progressing toward goals    Frequency  Min 2X/week    PT Plan Current plan remains appropriate    Co-evaluation             End of Session Equipment Utilized During Treatment: Gait belt Activity Tolerance: Patient limited by fatigue Patient left: with call bell/phone within reach;with family/visitor present;in chair;with chair alarm set     Time: 1127-1150 PT Time Calculation (min) (ACUTE ONLY): 23 min  Charges:  $Gait Training: 8-22  mins $Therapeutic Exercise: 8-22 mins                    G Codes:      Itzia Cunliffe H. Manson PasseyBrown, PT, DPT 01/04/2015, 2:57 PM [phone]

## 2015-01-05 ENCOUNTER — Inpatient Hospital Stay: Payer: Medicare Other

## 2015-01-05 LAB — CREATININE, SERUM
CREATININE: 1.31 mg/dL — AB (ref 0.61–1.24)
GFR calc Af Amer: 53 mL/min — ABNORMAL LOW (ref >60)
GFR, EST NON AFRICAN AMERICAN: 46 mL/min — AB (ref >60)

## 2015-01-05 MED ORDER — VANCOMYCIN HCL IN DEXTROSE 1-5 GM/200ML-% IV SOLN
1000.0000 mg | INTRAVENOUS | Status: DC
  Administered 2015-01-05 – 2015-01-06 (×2): 1000 mg via INTRAVENOUS
  Filled 2015-01-05 (×2): qty 200

## 2015-01-05 NOTE — Progress Notes (Signed)
Physical Therapy Treatment Patient Details Name: Terry Johnson MRN: [id number] DOB: [birth year] Today's Date: 01/05/2015    History of Present Illness presented to ED secondary to neck pain, progressive weakness/AMS and fall prior to admission; admitted for acute hospitalization secondary to sepsis related to severe cellulitis.      PT Comments    Patient with increased confusion, garbled speech this date; difficulty following commands for all functional activities. Very motor restless, constantly reaching/grabbing at lines, linens, etc.  Requiring increased level of assist (+2) for all mobility this date; unsafe for gait attempts today.  RN informed/aware of patient performance and need for +2 assist with return to bed.    Follow Up Recommendations  SNF     Equipment Recommendations       Recommendations for Other Services       Precautions / Restrictions Precautions Precautions: Fall Precaution Comments: Enteric isolation Restrictions Weight Bearing Restrictions: No    Mobility  Bed Mobility Overal bed mobility: Needs Assistance Bed Mobility: Supine to Sit     Supine to sit: Mod assist;Max assist     General bed mobility comments: hand over hand for placement/management of bilat UEs/LEs and truncal elevation  Transfers Overall transfer level: Needs assistance Equipment used: Rolling walker (2 wheeled) Transfers: Sit to/from Stand Sit to Stand: Max assist;+2 physical assistance            Ambulation/Gait             General Gait Details: unsafe/unable this date due to increased level of assist, decreased activity tolerance   Stairs            Wheelchair Mobility    Modified Rankin (Stroke Patients Only)       Balance Overall balance assessment: Needs assistance Sitting-balance support: No upper extremity supported Sitting balance-Leahy Scale: Poor Sitting balance - Comments: R lateral lean requiring cuing/assist from therapist for  correction to midline.  With balance work and positioning in midline, able to maintain with close sup.   Standing balance support: Bilateral upper extremity supported Standing balance-Leahy Scale: Poor                      Cognition Arousal/Alertness: Awake/alert Behavior During Therapy: Restless (motor restless, fidgeting) Overall Cognitive Status: Impaired/Different from baseline Area of Impairment: Orientation;Memory;Following commands;Safety/judgement;Awareness;Problem solving Orientation Level: Disoriented to;Time;Situation;Place   Memory: Decreased short-term memory Following Commands: Follows one step commands inconsistently Safety/Judgement: Decreased awareness of safety;Decreased awareness of deficits   Problem Solving: Slow processing;Decreased initiation;Difficulty sequencing;Requires verbal cues;Requires tactile cues      Exercises Other Exercises Other Exercises: Unsupported sitting balance, performed alternate UE reaching to promote L ant/lateral weight shift and midline orientation, min assist progressing to close sup.  Difficulty following simple commands for precise participation with activity this date. Other Exercises: Sit/stand with RW x5 reps, min/mod assist +1-2 for safety.  R lateral lean with excessive posterior weight shift; patient without spontaneous attemtps at correction this date.  Performance improved with repetition, but still requiring increased assist from previous sessions.    General Comments        Pertinent Vitals/Pain Pain Assessment: 0-10 Pain Score: 4  Pain Location: posterior cervical area Pain Descriptors / Indicators: Aching;Sore Pain Intervention(s): Limited activity within patient's tolerance;Monitored during session;Repositioned    Home Living                      Prior Function  PT Goals (current goals can now be found in the care plan section) Acute Rehab PT Goals Patient Stated Goal: patient  unable to verbalize; [facility] discharge PT Goal Formulation: With patient/family Time For Goal Achievement: 01/17/15 Potential to Achieve Goals: Fair Additional Goals Additional Goal #1: Basic transfers with LRAD, sup, for access to all seating surfaces in home environment. Additional Goal #2: Gait >150' with LRAD, sup, for household mobilization upon discharge. Progress towards PT goals: Not progressing toward goals - comment (decreased cognition, functional performance; increased assist this date)    Frequency  Min 2X/week    PT Plan Current plan remains appropriate    Co-evaluation             End of Session Equipment Utilized During Treatment: Gait belt Activity Tolerance: Patient limited by fatigue Patient left: with call bell/phone within reach;with family/visitor present;in chair;with chair alarm set     Time: 4098-11911126-1147 PT Time Calculation (min) (ACUTE ONLY): 21 min  Charges:  $Therapeutic Activity: 8-22 mins                    G Codes:      Terry Johnson, PT, DPT 01/05/2015, 12:01 PM [phone]

## 2015-01-05 NOTE — Progress Notes (Signed)
Patient ID: Terry EmeryJames C Johnson, male   DOB: [birth year], 81 y.o.   MRN: [id number] [facility] [address] [facility]   PATIENT NAME: Terry Johnson    MR#:  [id number]  DATE OF BIRTH:  [birth year]  SUBJECTIVE:  Came in with confusion and weakness. Was found to have cellulitis of the neck(back) -more confused than his baseline. Per RN evaluation patient is more weak today which is concerning  REVIEW OF SYSTEMS:    ROS unobtainable because of the altered mental status  Tolerating Diet: yes Tolerating PT: recommends rehab   DRUG ALLERGIES:   Allergies  Allergen Reactions  . Codeine Other (See Comments)    Reaction:  Unknown  . Ms Contin [Morphine Sulfate Er Beads] Other (See Comments)    Reaction:  Unknown    VITALS:  Blood pressure 141/54, pulse 88, temperature 97.6 F (36.4 C), temperature source Axillary, resp. rate 20, height 6' (1.829 m), weight 72.576 kg (160 lb), SpO2 95 %.  PHYSICAL EXAMINATION:   Physical Exam  Constitutional: He is well-developed, well-nourished, and in no distress. No distress.  HENT:  Head: Normocephalic and atraumatic.  Nose: Nose normal.  Mouth/Throat: Oropharynx is clear and moist.  Eyes: Conjunctivae and EOM are normal. Pupils are equal, round, and reactive to light.  Neck: Normal range of motion.  Cardiovascular: Normal rate, regular rhythm and normal heart sounds.  Exam reveals no friction rub.   Pulmonary/Chest: Effort normal and breath sounds normal. No respiratory distress. He has no wheezes. He has no rales.  Abdominal: Soft. Bowel sounds are normal. He exhibits no distension. There is no tenderness.  Musculoskeletal: Normal range of motion. He exhibits no edema.  Lymphadenopathy:    He has no cervical adenopathy.  Neurological: He has normal reflexes.  Skin: Skin is warm. Rash noted. He is not diaphoretic. There is erythema (over the ape of the neck).  Psychiatric: Mood and affect normal.   LABORATORY PANEL:    CBC  Recent Labs Lab 01/04/15 0806  WBC 20.9*  HGB 12.1*  HCT 37.3*  PLT 285   ------------------------------------------------------------------------------------------------------------------  Chemistries   Recent Labs Lab 01/02/15 1806  01/04/15 0806 01/05/15 0416  NA 138  < > 136  --   K 4.5  < > 3.9  --   CL 100*  < > 101  --   CO2 28  < > 26  --   GLUCOSE 128*  < > 120*  --   BUN 24*  < > 19  --   CREATININE 1.89*  < > 1.41* 1.31*  CALCIUM 8.9  < > 8.7*  --   AST 17  --   --   --   ALT 11*  --   --   --   ALKPHOS 88  --   --   --   BILITOT 0.6  --   --   --   < > = values in this interval not displayed.  RADIOLOGY:  No results found.   ASSESSMENT AND PLAN:   * Encephalopathy-probably secondary to cellulitis We will get CAT scan of the head because of the worsening of the weakness and confusion  * Sepsis - Secondary to his neck cellulitis.  -Hemodynamically stable -IVF and IV zosyn and Vanc -Evaluated by surgery -Check a.m. Labs -Urine culture with no growth so far  * Cellulitis of neck - without evidence of overt abscess of this point -CT soft tissue neck no abscess, s/o of cellulitis -  IV vanc and zosyn -WBC 18--.20K-->20K - Appreciate surgical consult , don't think he has drainable abscess -BC negative  *HTN (hypertension) - chronic problem, with mild elevation here, continue home meds to control this.  * Dementia - chronic problem, continue home medications  *GERD (gastroesophageal reflux disease) - chronic problem, home dose PPI   *Depression - chronic stable problem, continue home meds.  * Anxiety- chronic stable problem, continue home meds.  *Gen weakness PT recommends rehab  case discussed with Care Management/Social Worker. Management plans discussed with the patient, family and they are in agreement.  CODE STATUS: DNR DVT Prophylaxis: heparin  TOTAL TIME TAKING CARE OF THIS PATIENT: 30 minutes.   POSSIBLE D/C IN 2-3 DAYS,  DEPENDING ON CLINICAL CONDITION.   [name], Tim Wilhide M.D on 01/05/2015 at 2:48 PM  Between 7am to 6pm - Pager - [phone]  After 6pm go to [email or web address] - password [email or web address] [facility]  [facility]  Office  [phone]  CC: Primary care physician; No primary care provider on file.

## 2015-01-05 NOTE — Progress Notes (Signed)
Notified Dr. Amado CoeGouru of pt neuro status change from yesterday (increased confusion and agitation, increased slurring of speech and increased weakness), and Dr. Amado CoeGouru acknowledged and to order CT.

## 2015-01-05 NOTE — Progress Notes (Signed)
ANTIBIOTIC CONSULT NOTE - FOLLOW UP  Pharmacy Consult for Vancomycin/Zosyn Indication: Cellulitis/  Allergies  Allergen Reactions  . Codeine Other (See Comments)    Reaction:  Unknown  . Ms Contin [Morphine Sulfate Er Beads] Other (See Comments)    Reaction:  Unknown    Patient Measurements: Height: 6' (182.9 cm) Weight: 160 lb (72.576 kg) IBW/kg (Calculated) : 77.6  Vital Signs: Temp: 97.6 F (36.4 C) (05/19 1123) Temp Source: Axillary (05/19 0654) BP: 141/54 mmHg (05/19 1123) Pulse Rate: 88 (05/19 1123) Intake/Output from previous day: 05/18 0701 - 05/19 0700 In: 2693.8 [P.O.:600; I.V.:1793.8; IV Piggyback:300] Out: 800 [Urine:800] Intake/Output from this shift: Total I/O In: -  Out: 152 [Urine:150; Stool:2]  Labs:  Recent Labs  01/02/15 1826 01/03/15 0443 01/04/15 0806 01/05/15 0416  WBC 17.9* 20.4* 20.9*  --   HGB 12.2* 10.9* 12.1*  --   PLT 314 258 285  --   CREATININE  --  1.63* 1.41* 1.31*   Estimated Creatinine Clearance: 37.7 mL/min (by C-G formula based on Cr of 1.31).   Microbiology: Recent Results (from the past 720 hour(s))  Culture, blood (routine x 2)     Status: None (Preliminary result)   Collection Time: 01/02/15  8:46 PM  Result Value Ref Range Status   Specimen Description BLOOD  Final   Special Requests NONE  Final   Culture NO GROWTH 3 DAYS  Final   Report Status PENDING  Incomplete  MRSA PCR Screening     Status: Abnormal   Collection Time: 01/02/15 11:33 PM  Result Value Ref Range Status   MRSA by PCR NEGATIVE (A) NEGATIVE Final    Comment:        The GeneXpert MRSA Assay (FDA approved for NASAL specimens only), is one component of a comprehensive MRSA colonization surveillance program. It is not intended to diagnose MRSA infection nor to guide or monitor treatment for MRSA infections.   Urine culture     Status: None (Preliminary result)   Collection Time: 01/05/15 12:08 AM  Result Value Ref Range Status   Specimen  Description URINE, CLEAN CATCH  Final   Special Requests Zosyn, Vanc  Final   Culture NO GROWTH < 12 HOURS  Final   Report Status PENDING  Incomplete    Anti-infectives    Start     Dose/Rate Route Frequency Ordered Stop   01/03/15 0800  vancomycin (VANCOCIN) 1,250 mg in sodium chloride 0.9 % 250 mL IVPB     1,250 mg 166.7 mL/hr over 90 Minutes Intravenous Every 36 hours 01/03/15 0015     01/03/15 0600  piperacillin-tazobactam (ZOSYN) IVPB 3.375 g     3.375 g 12.5 mL/hr over 240 Minutes Intravenous 3 times per day 01/03/15 0015     01/02/15 2245  piperacillin-tazobactam (ZOSYN) IVPB 3.375 g     3.375 g 100 mL/hr over 30 Minutes Intravenous  Once 01/02/15 2243 01/03/15 0036   01/02/15 2245  vancomycin (VANCOCIN) IVPB 1000 mg/200 mL premix  Status:  Discontinued     1,000 mg 200 mL/hr over 60 Minutes Intravenous  Once 01/02/15 2243 01/03/15 0010   01/02/15 2030  vancomycin (VANCOCIN) IVPB 1000 mg/200 mL premix     1,000 mg 200 mL/hr over 60 Minutes Intravenous  Once 01/02/15 2019 01/02/15 2207      Assessment: Patient is a 81 yo male receiving empiric antibiotic therapy with Zosyn and Vancomycin.   Renal function improving, WBC remains elevated  Goal of Therapy:  Vancomycin trough  level 10-15 mcg/ml  Plan:  Current orders for vancomycin 1250mg  IV Q36H.  Will transition to vancomycin 1gm IV Q24H due to improved renal function. Will check trough prior to second dose of this regimen 5/20 at 21:00. This will not be at steady state  Measure antibiotic drug levels at steady state Follow up culture results  Continue Zosyn 3.375 gm IV q8h per EI protocol.   Garlon HatchetJody Israa Caban, PharmD Clinical Pharmacist 01/05/2015 1:18 PM

## 2015-01-06 ENCOUNTER — Inpatient Hospital Stay: Payer: Medicare Other

## 2015-01-06 LAB — BASIC METABOLIC PANEL
Anion gap: 7 (ref 5–15)
BUN: 20 mg/dL (ref 6–20)
CHLORIDE: 105 mmol/L (ref 101–111)
CO2: 26 mmol/L (ref 22–32)
Calcium: 8.4 mg/dL — ABNORMAL LOW (ref 8.9–10.3)
Creatinine, Ser: 1.49 mg/dL — ABNORMAL HIGH (ref 0.61–1.24)
GFR calc non Af Amer: 39 mL/min — ABNORMAL LOW (ref >60)
GFR, EST AFRICAN AMERICAN: 45 mL/min — AB (ref >60)
GLUCOSE: 108 mg/dL — AB (ref 65–99)
Potassium: 3.2 mmol/L — ABNORMAL LOW (ref 3.5–5.1)
Sodium: 138 mmol/L (ref 135–145)

## 2015-01-06 LAB — URINE CULTURE: Culture: 2000

## 2015-01-06 LAB — CBC
HCT: 34.1 % — ABNORMAL LOW (ref 40.0–52.0)
Hemoglobin: 11.1 g/dL — ABNORMAL LOW (ref 13.0–18.0)
MCH: 31.3 pg (ref 26.0–34.0)
MCHC: 32.5 g/dL (ref 32.0–36.0)
MCV: 96.3 fL (ref 80.0–100.0)
Platelets: 324 10*3/uL (ref 150–440)
RBC: 3.54 MIL/uL — ABNORMAL LOW (ref 4.40–5.90)
RDW: 14.1 % (ref 11.5–14.5)
WBC: 11.9 10*3/uL — ABNORMAL HIGH (ref 3.8–10.6)

## 2015-01-06 LAB — VANCOMYCIN, TROUGH: Vancomycin Tr: 25 ug/mL (ref 10–20)

## 2015-01-06 MED ORDER — VANCOMYCIN HCL IN DEXTROSE 1-5 GM/200ML-% IV SOLN
1000.0000 mg | INTRAVENOUS | Status: DC
  Filled 2015-01-06: qty 200

## 2015-01-06 NOTE — Progress Notes (Signed)
ANTIBIOTIC CONSULT NOTE - FOLLOW UP  Pharmacy Consult for Vancomycin/Zosyn Indication: Cellulitis/Sepsis  Allergies  Allergen Reactions  . Codeine Other (See Comments)    Reaction:  Unknown  . Ms Contin [Morphine Sulfate Er Beads] Other (See Comments)    Reaction:  Unknown    Patient Measurements: Height: 6' (182.9 cm) Weight: 160 lb (72.576 kg) IBW/kg (Calculated) : 77.6  Vital Signs: Temp: 97.7 F (36.5 C) (05/20 0829) Temp Source: Oral (05/20 0829) BP: 146/58 mmHg (05/20 0829) Pulse Rate: 79 (05/20 0829) Intake/Output from previous day: 05/19 0701 - 05/20 0700 In: 2016.3 [P.O.:120; I.V.:1591.3; IV Piggyback:305] Out: 452 [Urine:450; Stool:2] Intake/Output from this shift:    Labs:  Recent Labs  01/04/15 0806 01/05/15 0416 01/06/15 0851  WBC 20.9*  --  11.9*  HGB 12.1*  --  11.1*  PLT 285  --  324  CREATININE 1.41* 1.31* 1.49*   Estimated Creatinine Clearance: 33.2 mL/min (by C-G formula based on Cr of 1.49).   Microbiology: Recent Results (from the past 720 hour(s))  Culture, blood (routine x 2)     Status: None (Preliminary result)   Collection Time: 01/02/15  8:46 PM  Result Value Ref Range Status   Specimen Description BLOOD  Final   Special Requests NONE  Final   Culture NO GROWTH 4 DAYS  Final   Report Status PENDING  Incomplete  MRSA PCR Screening     Status: Abnormal   Collection Time: 01/02/15 11:33 PM  Result Value Ref Range Status   MRSA by PCR NEGATIVE (A) NEGATIVE Final    Comment:        The GeneXpert MRSA Assay (FDA approved for NASAL specimens only), is one component of a comprehensive MRSA colonization surveillance program. It is not intended to diagnose MRSA infection nor to guide or monitor treatment for MRSA infections.   Urine culture     Status: None (Preliminary result)   Collection Time: 01/05/15 12:08 AM  Result Value Ref Range Status   Specimen Description URINE, CLEAN CATCH  Final   Special Requests Zosyn, Vanc   Final   Culture NO GROWTH < 12 HOURS  Final   Report Status PENDING  Incomplete    Anti-infectives    Start     Dose/Rate Route Frequency Ordered Stop   01/05/15 2117  vancomycin (VANCOCIN) IVPB 1000 mg/200 mL premix     1,000 mg 200 mL/hr over 60 Minutes Intravenous Every 24 hours 01/05/15 1321     01/03/15 0800  vancomycin (VANCOCIN) 1,250 mg in sodium chloride 0.9 % 250 mL IVPB  Status:  Discontinued     1,250 mg 166.7 mL/hr over 90 Minutes Intravenous Every 36 hours 01/03/15 0015 01/05/15 1321   01/03/15 0600  piperacillin-tazobactam (ZOSYN) IVPB 3.375 g     3.375 g 12.5 mL/hr over 240 Minutes Intravenous 3 times per day 01/03/15 0015     01/02/15 2245  piperacillin-tazobactam (ZOSYN) IVPB 3.375 g     3.375 g 100 mL/hr over 30 Minutes Intravenous  Once 01/02/15 2243 01/03/15 0036   01/02/15 2245  vancomycin (VANCOCIN) IVPB 1000 mg/200 mL premix  Status:  Discontinued     1,000 mg 200 mL/hr over 60 Minutes Intravenous  Once 01/02/15 2243 01/03/15 0010   01/02/15 2030  vancomycin (VANCOCIN) IVPB 1000 mg/200 mL premix     1,000 mg 200 mL/hr over 60 Minutes Intravenous  Once 01/02/15 2019 01/02/15 2207      Assessment: Patient is a 81 yo male receiving empiric  antibiotic therapy with Zosyn and Vancomycin.    Goal of Therapy:  Vancomycin trough level 10-15 mcg/ml  Plan:  Will continue current orders for vancomycin 1gm IV Q24H Will check trough prior to second dose of this regimen 5/20 at 21:00. This will not be at steady state  Continue Zosyn 3.375 gm IV q8h per EI protocol.   Garlon HatchetJody Kolt Mcwhirter, PharmD Clinical Pharmacist 01/06/2015 11:01 AM

## 2015-01-06 NOTE — Progress Notes (Signed)
Patient ID: Terry Johnson, male   DOB: March 09, 1921, 81 y.o.   MRN: [id number] [facility]   PATIENT NAME: Terry Johnson    MR#:  [id number]  DATE OF BIRTH:  March 09, 1921  SUBJECTIVE:  Came in with confusion and weakness. Was found to have cellulitis of the neck(back) -more awake and alert today morning and having good conversation according to the 2 son's at bedside. Patient usually takes several naps according to 2 son's. Today patient is very tired and sleepy during my examination intermittent episodes of cough.   REVIEW OF SYSTEMS:    ROS unobtainable because of baseline dementia  Tolerating Diet: yes Tolerating PT: Yes   DRUG ALLERGIES:   Allergies  Allergen Reactions  . Codeine Other (See Comments)    Reaction:  Unknown  . Ms Contin [Morphine Sulfate Er Beads] Other (See Comments)    Reaction:  Unknown    VITALS:  Blood pressure 146/58, pulse 79, temperature 97.7 F (36.5 C), temperature source Oral, resp. rate 18, height 6' (1.829 m), weight 72.576 kg (160 lb), SpO2 96 %.  PHYSICAL EXAMINATION:   Physical Exam  Constitutional: He is well-developed, well-nourished, and in no distress. No distress.  HENT:  Head: Normocephalic and atraumatic.  Nose: Nose normal.  Mouth/Throat: Oropharynx is clear and moist.  Eyes: Conjunctivae and EOM are normal. Pupils are equal, round, and reactive to light.  Neck: Normal range of motion.  Cardiovascular: Normal rate, regular rhythm and normal heart sounds.  Exam reveals no friction rub.   Pulmonary/Chest: Effort normal and breath sounds normal. No respiratory distress. He has no wheezes. He has no rales.  Abdominal: Soft. Bowel sounds are normal. He exhibits no distension. There is no tenderness.  Musculoskeletal: Normal range of motion. He exhibits no edema.  Lymphadenopathy:    He has no cervical adenopathy.  Neurological: He has normal reflexes.  Skin: Skin is warm. Rash noted.  He is not diaphoretic. There is erythema (over the ape of the neck).  Psychiatric: Mood and affect normal.   LABORATORY PANEL:   CBC  Recent Labs Lab 01/06/15 0851  WBC 11.9*  HGB 11.1*  HCT 34.1*  PLT 324   ------------------------------------------------------------------------------------------------------------------  Chemistries   Recent Labs Lab 01/02/15 1806  01/06/15 0851  NA 138  < > 138  K 4.5  < > 3.2*  CL 100*  < > 105  CO2 28  < > 26  GLUCOSE 128*  < > 108*  BUN 24*  < > 20  CREATININE 1.89*  < > 1.49*  CALCIUM 8.9  < > 8.4*  AST 17  --   --   ALT 11*  --   --   ALKPHOS 88  --   --   BILITOT 0.6  --   --   < > = values in this interval not displayed.  RADIOLOGY:  Ct Head Wo Contrast  01/05/2015   CLINICAL DATA:  81 year old male with confusion and weakness. Symptoms increasing. Diagnosed with neck cellulitis. Initial encounter.  EXAM: CT HEAD WITHOUT CONTRAST  TECHNIQUE: Contiguous axial images were obtained from the base of the skull through the vertex without intravenous contrast.  COMPARISON:  Neck CT 01/02/2015 and earlier.  FINDINGS: Visualized paranasal sinuses and mastoids are clear. No acute osseous abnormality identified. Suboccipital scalp soft tissue stranding as described on the recent neck CT (please see that report).  Postoperative changes to the globes. Superior scalp soft tissues remain normal.  Calcified atherosclerosis at the skull base. Cerebral volume is stable and normal for age. No ventriculomegaly. Chronic dural calcification along the interhemispheric fissure. No midline shift, mass effect, or evidence of intracranial mass lesion. No acute intracranial hemorrhage identified. No evidence of cortically based acute infarction identified. No suspicious intracranial vascular hyperdensity.  IMPRESSION: 1. Stable and negative for age noncontrast CT appearance of the brain. 2. Posterior neck edema/cellulitis, see Neck CT report 01/02/2015.    Electronically Signed   By: Terry FlemingH  Hall Johnson.   On: 01/05/2015 16:15     ASSESSMENT AND PLAN:   * Encephalopathy-probably secondary to cellulitis Improved, almost at baseline per family Normal CAT scan of the head * Sepsis - Secondary to his neck cellulitis.  -Hemodynamically stable -Discontinue IVF and continue IV zosyn and Vanc -Evaluated by surgery-non-drainable  -Check a.m. Labs -Urine culture and blood cultures with no growth so far  * Cellulitis of neck - without evidence of overt abscess of this point -CT soft tissue neck no abscess, s/o of cellulitis -IV vanc and zosyn -WBC 18--.20K-->20K-->12k - Appreciate surgical consult , don't think he has drainable abscess -BC negative  *Cough-2 view chest x-ray to rule out pneumonia  *HTN (hypertension) - chronic problem, with mild elevation here, continue home meds to control this.  * Dementia - chronic problem, continue home medications  *GERD (gastroesophageal reflux disease) - chronic problem, home dose PPI   *Depression - chronic stable problem, continue home meds.  * Anxiety- chronic stable problem, continue home meds.  *Gen weakness PT recommends rehab  [facility] to continue physical therapy in a.m. if chest x-ray is normal, and clinically stable  case discussed with Care Management/Social Worker. Management plans discussed with the patient, family and they are in agreement.  CODE STATUS: DNR DVT Prophylaxis: heparin  TOTAL TIME TAKING CARE OF THIS PATIENT: 35 minutes.   POSSIBLE D/C IN 1-2 DAYS, DEPENDING ON CLINICAL CONDITION.   Terry Johnson, Terry Johnson on 01/06/2015 at 12:29 PM  Between 7am to 6pm - Pager - [phone]  After 6pm go to [email or web address] - password [email or web address] [facility]  [facility]  Office  [phone]  CC: Primary care physician; No primary care provider on file.

## 2015-01-06 NOTE — Progress Notes (Signed)
Vancomycin Follow-up:  Patient currently ordered Vancomycin 1g IV q24h. 5/20 21:00 VT=24.8 mcg/ml, SCr=1.49 (up slightly)  Will decrease to Vancomycin 1g IV q36h.  Clovia CuffLisa Jeanie Mccard, PharmD, BCPS 01/06/2015 10:23 PM

## 2015-01-06 NOTE — Progress Notes (Signed)
Physical Therapy Treatment Patient Details Name: Terry Johnson MRN: [id number] DOB: Oct 16, 1920 Today's Date: 01/06/2015    History of Present Illness presented to ED secondary to neck pain, progressive weakness/AMS and fall prior to admission; admitted for acute hospitalization secondary to sepsis related to severe cellulitis.      PT Comments    Improved alertness and ability to follow commands this date.  Able to complete short-distance gait training, though requiring mod assist +2 for LEFT lateral lean this date.  Patietn unaware of balance disturbance with limited attempts at spontaneous correction. Functional ability/performance fluctuates greatly between all sessions; recommend planning for +2 assist with all mobility upon discharge.  Follow Up Recommendations  SNF     Equipment Recommendations       Recommendations for Other Services       Precautions / Restrictions Precautions Precautions: Fall Precaution Comments: Enteric isolation Restrictions Weight Bearing Restrictions: No    Mobility  Bed Mobility Overal bed mobility: Needs Assistance Bed Mobility: Supine to Sit     Supine to sit: Mod assist     General bed mobility comments: hand over hand for placement/management of bilat UEs/LEs and truncal elevation  Transfers Overall transfer level: Needs assistance Equipment used: Rolling walker (2 wheeled) Transfers: Sit to/from Stand Sit to Stand: Mod assist;+2 physical assistance         General transfer comment: constantly pulls up on RW despite cuing; physical assist for anterior weight translation with lift off  Ambulation/Gait Ambulation/Gait assistance: Mod assist;+2 physical assistance Ambulation Distance (Feet): 30 Feet Assistive device: Rolling walker (2 wheeled)       General Gait Details: mixed step to and reciprocal stepping pattern with forward flexed posture; progressive L lateral lean with excessive L LE abduct in stance (narrowing BOS).   Minimal awareness of balance deficits; no attempts at spontaneous correction.   Stairs            Wheelchair Mobility    Modified Rankin (Stroke Patients Only)       Balance                                    Cognition                            Exercises Other Exercises Other Exercises: Sit/stand x5 with RW, min/mod assist for lift off and dynamic balance; constantly reaching/pulling on RW. Other Exercises: Standing balance activities with RW, min/mod assist: alternate LE forward/backward/lateral stepping; emphasis on balance correction with return to midline.    General Comments        Pertinent Vitals/Pain Pain Assessment: Faces Faces Pain Scale: Hurts little more Pain Location: posterior cervical area Pain Descriptors / Indicators: Aching;Sore Pain Intervention(s): Limited activity within patient's tolerance;Monitored during session;Repositioned    Home Living                      Prior Function            PT Goals (current goals can now be found in the care plan section) Acute Rehab PT Goals Patient Stated Goal: patient unable to verbalize; [facility] physically able at discharge PT Goal Formulation: With patient/family Time For Goal Achievement: 01/17/15 Potential to Achieve Goals: Fair Additional Goals Additional Goal #1: Basic transfers with LRAD, sup, for access to all  seating surfaces in home environment. Additional Goal #2: Gait >150' with LRAD, sup, for household mobilization upon discharge. Progress towards PT goals: Progressing toward goals    Frequency  Min 2X/week    PT Plan Current plan remains appropriate    Co-evaluation             End of Session Equipment Utilized During Treatment: Gait belt Activity Tolerance: Patient limited by fatigue Patient left: with call bell/phone within reach;with family/visitor present;in chair;with chair alarm set      Time: [id number] PT Time Calculation (min) (ACUTE ONLY): 35 min  Charges:  $Gait Training: 8-22 mins $Therapeutic Activity: 8-22 mins                    G Codes:      Jazzie Trampe H. Manson PasseyBrown, PT, DPT 01/06/2015, 1:53 PM [phone]

## 2015-01-06 NOTE — Progress Notes (Signed)
Surgery Progress Note  S: Confused O: AF/VSS, good uop GEN: NAD, interactive NECK: induration improved, some areas of spontaneous drainage, no fluctuance  A/P 81 yo admit with confusion, neck cellulitis - no obvious drainable abscess, no acute surgical issues

## 2015-01-06 NOTE — Clinical Social Work Note (Signed)
Physical therapy is recommending SNF level of care at discharge. CSW aware of previous evaluation by [facility] stating [facility] could still return to them. CSW met with patient's son to let him know the recommendation for SNF by physical therapy and that CSW was going to check with [facility] to make sure they haven't changed their disposition. Clifton Custard at [facility] informed CSW that they can still take patient back and that they will plan on having physical therapy work with him 3 X per week and that the son agreed to pay for a sitter should they run into an issue. CSW updated patient's son and he was good with this. Shela Leff MSW,LCSWA [phone]

## 2015-01-06 NOTE — Progress Notes (Signed)
Initial Nutrition Assessment  DOCUMENTATION CODES:     INTERVENTION: Meals and Snacks: Cater to patient preferences; will send Mechanical Soft options when pt family does not order for pt per family request Medical Food Supplement Therapy: will send  Magic cup BID Coordination of Care: will recommend SLP evaluation if pt at risk for aspiration  NUTRITION DIAGNOSIS: Reassess on follow  GOAL:  Patient will meet greater than or equal to 90% of their needs  MONITOR:   (Energy Intake, Digestive System, Electrolyte and renal)  REASON FOR ASSESSMENT:   (RD Screen: Length of Stay)    ASSESSMENT:  Pt admitted with AMS and sepsis secondary to neck cellulitis; per surgery no surgical interventions at this time PMHx:  Past Medical History  Diagnosis Date  . Dementia   . Chronic pain   . Anemia   . Hypertension   . GERD (gastroesophageal reflux disease)   . Depression   . Anxiety   . Pneumonitis   . Renal disorder    PO Intake: pt family reports that pt ate some breakfast this am that family assisted with feeding. Pt family reports usually pt is not a feeder but has been during this acute illness. Pt family reports pt usually has a good appetite eating 3 meals a day every day PTA. Pt family reports some difficulty swallowing at times during this admission, none prior.  Pt remained asleep during interview.  Medications: FeSO4, Folic acid, Protonix Labs: Electrolyte and Renal Profile:    Recent Labs Lab 01/03/15 0443 01/04/15 0806 01/05/15 0416 01/06/15 0851  BUN 23* 19  --  20  CREATININE 1.63* 1.41* 1.31* 1.49*  NA 135 136  --  138  K 4.0 3.9  --  3.2*   Glucose Profile: No results for input(s): GLUCAP in the last 72 hours. Protein Profile:  Recent Labs Lab 01/02/15 1806  ALBUMIN 3.9   Pt family reports pt has had stable weight PTA  Height:  Ht Readings from Last 1 Encounters:  01/02/15 6' (1.829 m)    Weight:  Wt Readings from Last 1 Encounters:   01/05/15 160 lb (72.576 kg)     Wt Readings from Last 10 Encounters:  01/05/15 160 lb (72.576 kg)     Unable to complete Nutrition-Focused physical exam at this time.     BMI:  Body mass index is 21.7 kg/(m^2).   Diet Order:  Diet Heart Room service appropriate?: Yes; Fluid consistency:: Thin  EDUCATION NEEDS:  No education needs identified at this time   Intake/Output Summary (Last 24 hours) at 01/06/15 1246 Last data filed at 01/06/15 0830  Gross per 24 hour  Intake 2016.25 ml  Output    300 ml  Net 1716.25 ml    Last BM:  5/19  LOW Care Level  Leda QuailAllyson Benetta Maclaren, RD, LDN Pager [phone]

## 2015-01-07 LAB — CBC
HCT: 32.4 % — ABNORMAL LOW (ref 40.0–52.0)
HEMOGLOBIN: 10.9 g/dL — AB (ref 13.0–18.0)
MCH: 32.1 pg (ref 26.0–34.0)
MCHC: 33.5 g/dL (ref 32.0–36.0)
MCV: 95.7 fL (ref 80.0–100.0)
PLATELETS: 292 10*3/uL (ref 150–440)
RBC: 3.39 MIL/uL — ABNORMAL LOW (ref 4.40–5.90)
RDW: 14.4 % (ref 11.5–14.5)
WBC: 10.1 10*3/uL (ref 3.8–10.6)

## 2015-01-07 LAB — CULTURE, BLOOD (ROUTINE X 2): Culture: NO GROWTH

## 2015-01-07 MED ORDER — SULFAMETHOXAZOLE-TRIMETHOPRIM 800-160 MG PO TABS: 1.0000 | ORAL_TABLET | Freq: Two times a day (BID) | ORAL | Status: AC

## 2015-01-07 MED ORDER — TRAMADOL HCL 50 MG PO TABS: 50.0000 mg | ORAL_TABLET | Freq: Two times a day (BID) | ORAL | Status: AC

## 2015-01-07 MED ORDER — HYDROCODONE-ACETAMINOPHEN 5-325 MG PO TABS: 0.5000 | ORAL_TABLET | Freq: Three times a day (TID) | ORAL | Status: AC | PRN

## 2015-01-07 NOTE — Discharge Summary (Signed)
[facility] - [place] at [facility]   PATIENT NAME: Terry Johnson    MR#:  [id number]  DATE OF BIRTH:  [birth year]  DATE OF ADMISSION:  01/02/2015 ADMITTING PHYSICIAN: Oralia Manis, MD  DATE OF DISCHARGE: 01/07/2015  PRIMARY CARE PHYSICIAN: Primary care physician at [facility]  ADMISSION DIAGNOSIS:  Abscess [L02.91] Cellulitis, neck [L03.221]  DISCHARGE DIAGNOSIS:  Principal Problem:   Sepsis secondary to cellulitis of the neck Active Problems:   Cellulitis of neck   HTN (hypertension)   Dementia   GERD (gastroesophageal reflux disease)   Depression   Anxiety   SECONDARY DIAGNOSIS:   Past Medical History  Diagnosis Date  . Dementia   . Chronic pain   . Anemia   . Hypertension   . GERD (gastroesophageal reflux disease)   . Depression   . Anxiety   . Pneumonitis   . Renal disorder     HOSPITAL COURSE:   brief history and physical-patient is a 81 year old male brought into the ED for pain and redness in the neck. Temperature wasn't 100.2 in the ED with elevated white count. Patient was found to be confused. Patient was unable to provide any history because of his underlying dementia. Please review history and physical for details.  Hospital course  * Encephalopathy-probably secondary to cellulitis Improved, almost at baseline per family Normal CAT scan of the head   * Sepsis - Secondary to his neck cellulitis.  -Hemodynamically stable -Clinical situations intermittently improved with IVF and IV zosyn and Vanc. Discontinue IV Zosyn and vancomycin and discharge patient with by mouth Bactrim DS for 10 days -Evaluated by surgery-non-drainable  -Urine culture and blood cultures with no growth so far -CT soft tissue neck no abscess, s/o of cellulitis  *Cough-2 view chest x-ray with no acute findings   *HTN (hypertension) - chronic problem, with mild elevation here, continue home meds to control this.  * Dementia - chronic problem,  continue home medications  *GERD (gastroesophageal reflux disease) - chronic problem, home dose PPI   *Depression - chronic stable problem, continue home meds.  * Anxiety- chronic stable problem, continue home meds.  *Gen weakness PT recommends rehab  Will discharge him back to [place] assisted living facility to continue physical therapy .  DISCHARGE CONDITIONS:  Satisfactory  CONSULTS OBTAINED:  Treatment Team:  Ida Rogue, MD Ramonita Lab, MD   PROCEDURES none  DRUG ALLERGIES:   Allergies  Allergen Reactions  . Codeine Other (See Comments)    Reaction:  Unknown  . Ms Contin [Morphine Sulfate Er Beads] Other (See Comments)    Reaction:  Unknown    DISCHARGE MEDICATIONS:   Current Discharge Medication List    CONTINUE these medications which have CHANGED   Details  HYDROcodone-acetaminophen (NORCO/VICODIN) 5-325 MG per tablet Take 0.5 tablets by mouth every 8 (eight) hours as needed for moderate pain. Qty: 10 tablet, Refills: 0    sulfamethoxazole-trimethoprim (BACTRIM DS,SEPTRA DS) 800-160 MG per tablet Take 1 tablet by mouth 2 (two) times daily. Qty: 20 tablet, Refills: 0    traMADol (ULTRAM) 50 MG tablet Take 1 tablet (50 mg total) by mouth every 12 (twelve) hours. Qty: 15 tablet, Refills: 0      CONTINUE these medications which have NOT CHANGED   Details  !! acetaminophen (TYLENOL) 325 MG tablet Take 325 mg by mouth 3 (three) times daily.    !! acetaminophen (TYLENOL) 500 MG tablet Take 1,000 mg by mouth 2 (two) times daily as needed for mild  pain or fever.    amiodarone (PACERONE) 200 MG tablet Take 200 mg by mouth 2 (two) times daily.    amLODipine (NORVASC) 10 MG tablet Take 10 mg by mouth at bedtime.    camphor-menthol (SARNA) lotion Apply 1 application topically 2 (two) times daily as needed for itching.    donepezil (ARICEPT) 10 MG tablet Take 10 mg by mouth at bedtime.    ferrous sulfate 325 (65 FE) MG tablet Take 325 mg by mouth 3  (three) times daily.    folic acid (FOLVITE) 1 MG tablet Take 1 mg by mouth every morning.    gabapentin (NEURONTIN) 100 MG capsule Take 100 mg by mouth every morning.    hydrOXYzine (ATARAX/VISTARIL) 10 MG tablet Take 10 mg by mouth at bedtime.    ipratropium (ATROVENT) 0.03 % nasal spray Place 2 sprays into both nostrils 2 (two) times daily.    magnesium hydroxide (MILK OF MAGNESIA) 400 MG/5ML suspension Take 30 mLs by mouth daily as needed for mild constipation.    Multiple Vitamins-Minerals (PRESERVISION AREDS PO) Take 1 capsule by mouth 2 (two) times daily.    mupirocin ointment (BACTROBAN) 2 % Place 1 application into the nose 3 (three) times daily.    neomycin-bacitracin-polymyxin (NEOSPORIN) ointment Apply 1 application topically 2 (two) times daily. apply to eye    OLANZapine (ZYPREXA) 5 MG tablet Take 5 mg by mouth every morning.    pantoprazole (PROTONIX) 40 MG tablet Take 40 mg by mouth every morning.    Pramoxine-Calamine (AVEENO ANTI-ITCH EX) Apply 1 application topically at bedtime.    traZODone (DESYREL) 50 MG tablet Take 25 mg by mouth at bedtime.    triamcinolone (KENALOG) 0.025 % cream Apply 1 application topically every morning. Pt mixes with Cerave.  (1:1 mixture)    venlafaxine XR (EFFEXOR-XR) 150 MG 24 hr capsule Take 150 mg by mouth every morning.    vitamin B-12 (CYANOCOBALAMIN) 1000 MCG tablet Take 1,000 mcg by mouth every morning.     !! - Potential duplicate medications found. Please discuss with provider.       DISCHARGE INSTRUCTIONS:   Follow-up with primary care physician in a week Continue wound care Continue physical therapy  DIET:   Low-salt DISCHARGE CONDITION:  Satisfactory  ACTIVITY:  As tolerated, as recorded by physical therapy  OXYGEN:  Home Oxygen: not needed     [facility]  If you experience worsening of your admission symptoms, develop shortness of breath, life  threatening emergency, suicidal or homicidal thoughts you must seek medical attention immediately by calling 911 or calling your MD immediately  if symptoms less severe.  You Must read complete instructions/literature along with all the possible adverse reactions/side effects for all the Medicines you take and that have been prescribed to you. Take any new Medicines after you have completely understood and accpet all the possible adverse reactions/side effects.   Please note  You were cared for by a hospitalist during your hospital stay. If you have any questions about your discharge medications or the care you received while you were in the hospital after you are discharged, you can call the unit and asked to speak with the hospitalist on call if the hospitalist that took care of you is not available. Once you are discharged, your primary care physician will handle any further medical issues. Please note that NO REFILLS for any discharge medications will be authorized once you are discharged, as it is imperative that  you return to your primary care physician (or establish a relationship with a primary care physician if you do not have one) for your aftercare needs so that they can reassess your need for medications and monitor your lab values.     Today  Chief Complaint  Patient presents with  . Weakness  . Altered Mental Status    per EMS "not acting right"  . Fall    fell while using walker    feels better  ROS: unobtainable as the patient is demented  VITAL SIGNS:  Blood pressure 160/60, pulse 81, temperature 97.7 F (36.5 C), temperature source Oral, resp. rate 18, height 6' (1.829 m), weight 72.576 kg (160 lb), SpO2 93 %.  I/O:   Intake/Output Summary (Last 24 hours) at 01/07/15 1328 Last data filed at 01/07/15 1207  Gross per 24 hour  Intake     50 ml  Output      0 ml  Net     50 ml    PHYSICAL EXAMINATION:  GENERAL:  81 y.o.-year-old patient lying in the bed with no  acute distress.  EYES: Pupils equal, round, reactive to light and accommodation. No scleral icterus. Extraocular muscles intact.  HEENT: Head atraumatic, normocephalic. Oropharynx and nasopharynx clear.  NECK:  Supple, no jugular venous distention. No thyroid enlargement, no tenderness.  LUNGS: Normal breath sounds bilaterally, no wheezing, rales,rhonchi or crepitation. No use of accessory muscles of respiration.  CARDIOVASCULAR: S1, S2 normal. No murmurs, rubs, or gallops.  ABDOMEN: Soft, non-tender, non-distended. Bowel sounds present. No organomegaly or mass.  EXTREMITIES: No pedal edema, cyanosis, or clubbing.  NEUROLOGIC: Cranial nerves II through XII are intact. Muscle strength 5/5 in all extremities. Sensation intact. Gait not checked.  PSYCHIATRIC: The patient is alert and oriented x 3.  SKIN: No obvious rash, lesion, or ulcer.   DATA REVIEW:   CBC  Recent Labs Lab 01/07/15 0622  WBC 10.1  HGB 10.9*  HCT 32.4*  PLT 292    Chemistries   Recent Labs Lab 01/02/15 1806  01/06/15 0851  NA 138  < > 138  K 4.5  < > 3.2*  CL 100*  < > 105  CO2 28  < > 26  GLUCOSE 128*  < > 108*  BUN 24*  < > 20  CREATININE 1.89*  < > 1.49*  CALCIUM 8.9  < > 8.4*  AST 17  --   --   ALT 11*  --   --   ALKPHOS 88  --   --   BILITOT 0.6  --   --   < > = values in this interval not displayed.  Cardiac Enzymes No results for input(s): TROPONINI in the last 168 hours.  Microbiology Results  Results for orders placed or performed during the hospital encounter of 01/02/15  Culture, blood (routine x 2)     Status: None   Collection Time: 01/02/15  8:46 PM  Result Value Ref Range Status   Specimen Description BLOOD  Final   Special Requests NONE  Final   Culture NO GROWTH 5 DAYS  Final   Report Status 01/07/2015 FINAL  Final  MRSA PCR Screening     Status: Abnormal   Collection Time: 01/02/15 11:33 PM  Result Value Ref Range Status   MRSA by PCR NEGATIVE (A) NEGATIVE Final     Comment:        The GeneXpert MRSA Assay (FDA approved for NASAL specimens only), is one component of  a comprehensive MRSA colonization surveillance program. It is not intended to diagnose MRSA infection nor to guide or monitor treatment for MRSA infections.   Urine culture     Status: None   Collection Time: 01/05/15 12:08 AM  Result Value Ref Range Status   Specimen Description URINE, CLEAN CATCH  Final   Special Requests Zosyn, Vanc  Final   Culture 2,000 COLONIES/mL GRAM NEGATIVE RODS  Final   Report Status 01/06/2015 FINAL  Final    RADIOLOGY:  Dg Chest 2 View  01/06/2015   CLINICAL DATA:  81 year old male with cough. Initial encounter. Current history of dementia  EXAM: CHEST  2 VIEW  COMPARISON:  05/13/2014 and earlier.  FINDINGS: Seated AP and lateral views of the chest. Low lung volumes. Stable cardiac size and mediastinal contours. No pneumothorax or pulmonary edema. No pleural effusion or consolidation. Osteopenia. No acute osseous abnormality identified.  IMPRESSION: Low lung volumes, otherwise no acute cardiopulmonary abnormality.   Electronically Signed   By: Odessa Fleming M.D.   On: 01/06/2015 14:12   Ct Head Wo Contrast  01/05/2015   CLINICAL DATA:  81 year old male with confusion and weakness. Symptoms increasing. Diagnosed with neck cellulitis. Initial encounter.  EXAM: CT HEAD WITHOUT CONTRAST  TECHNIQUE: Contiguous axial images were obtained from the base of the skull through the vertex without intravenous contrast.  COMPARISON:  Neck CT 01/02/2015 and earlier.  FINDINGS: Visualized paranasal sinuses and mastoids are clear. No acute osseous abnormality identified. Suboccipital scalp soft tissue stranding as described on the recent neck CT (please see that report).  Postoperative changes to the globes. Superior scalp soft tissues remain normal.  Calcified atherosclerosis at the skull base. Cerebral volume is stable and normal for age. No ventriculomegaly. Chronic dural  calcification along the interhemispheric fissure. No midline shift, mass effect, or evidence of intracranial mass lesion. No acute intracranial hemorrhage identified. No evidence of cortically based acute infarction identified. No suspicious intracranial vascular hyperdensity.  IMPRESSION: 1. Stable and negative for age noncontrast CT appearance of the brain. 2. Posterior neck edema/cellulitis, see Neck CT report 01/02/2015.   Electronically Signed   By: Odessa Fleming M.D.   On: 01/05/2015 16:15    EKG:   Orders placed or performed during the hospital encounter of 01/02/15  . ED EKG  . ED EKG  . EKG 12-Lead  . EKG 12-Lead      Management plans discussed with the patient, family and they are in agreement.  CODE STATUS:     Code Status Orders        Start     Ordered   01/02/15 2244  Do not attempt resuscitation (DNR)   Continuous    Question Answer Comment  In the event of cardiac or respiratory ARREST Do not call a "code blue"   In the event of cardiac or respiratory ARREST Do not perform Intubation, CPR, defibrillation or ACLS   In the event of cardiac or respiratory ARREST Use medication by any route, position, wound care, and other measures to relive pain and suffering. May use oxygen, suction and manual treatment of airway obstruction as needed for comfort.      01/02/15 2243    Advance Directive Documentation        Most Recent Value   Type of Advance Directive  Healthcare Power of Attorney [Son is HCPOA]   Pre-existing out of facility DNR order (yellow form or pink MOST form)  Physician notified to receive inpatient order   "MOST"  Form in Place?        TOTAL TIME TAKING CARE OF THIS PATIENT: 45 minutes.    @[facility] @  on 01/07/2015 at 1:28 PM  Between 7am to 6pm - Pager - [phone]  After 6pm go to [email or web address] - password [email or web address] [facility]  [facility]  Office  [phone]  CC: Primary care physician; physician at [facility] assisted living  facility

## 2015-01-07 NOTE — Progress Notes (Signed)
Notified Dr. Amado CoeGouru of concern over pt lung sounds. Stated they sounded more congested than in the morning. Asked if she still wanted to d/c pt. Dr. stated she was satisfied w pt xray results, and that she had listened to lungs, and yes to proceed with d/c.

## 2015-01-07 NOTE — Progress Notes (Signed)
[facility]. Pt's son at bedside and reports he will provide transport to ALF. FL2 updated with d/c summary meds and packet complete. CS[facility] that [facility] is able to return today. [facility]. RN updated. CSW signing off. Dellie BurnsJosie Ernesto Zukowski, MSW, LCSW [phone] (weekend coverage)

## 2015-01-07 NOTE — Progress Notes (Signed)
Pt d/c to assisted living; d/c packet prepared by social worker given to pt son; d/c instructions reviewed, pt son verbalized understanding; IV removed catheter in tact, gauze dressing applied; all questions answered; pt left unit via wheelchair accompanied by Zettie CooleyMichael Arely Tinner, RN and Buddy DutyJosh Simser, RN and assisted into car.

## 2015-03-28 ENCOUNTER — Encounter: Payer: Self-pay | Admitting: Podiatry

## 2015-03-28 ENCOUNTER — Ambulatory Visit (INDEPENDENT_AMBULATORY_CARE_PROVIDER_SITE_OTHER): Payer: Medicare Other

## 2015-03-28 ENCOUNTER — Ambulatory Visit (INDEPENDENT_AMBULATORY_CARE_PROVIDER_SITE_OTHER): Payer: Medicare Other | Admitting: Podiatry

## 2015-03-28 VITALS — BP 139/63 | HR 68 | Resp 16

## 2015-03-28 DIAGNOSIS — L97521 Non-pressure chronic ulcer of other part of left foot limited to breakdown of skin: Secondary | ICD-10-CM | POA: Diagnosis not present

## 2015-03-28 DIAGNOSIS — M79676 Pain in unspecified toe(s): Secondary | ICD-10-CM | POA: Diagnosis not present

## 2015-03-28 DIAGNOSIS — L03032 Cellulitis of left toe: Secondary | ICD-10-CM | POA: Diagnosis not present

## 2015-03-28 DIAGNOSIS — L97321 Non-pressure chronic ulcer of left ankle limited to breakdown of skin: Secondary | ICD-10-CM

## 2015-03-28 DIAGNOSIS — B351 Tinea unguium: Secondary | ICD-10-CM

## 2015-03-28 MED ORDER — CEPHALEXIN 500 MG PO CAPS: 500.0000 mg | ORAL_CAPSULE | Freq: Two times a day (BID) | ORAL | Status: AC

## 2015-03-28 NOTE — Progress Notes (Signed)
   Subjective:    Patient ID: Terry Johnson, male    DOB: May 16, 1921, 81 y.o.   MRN: [id number]  HPI  81 year old male presents the office today with concerns of his toenails falling off to both big toes. He also states his left big toe has been somewhat sore over the last couple weeks has been slightly swollen. He denies any open lesions or any drainage to the toe. Before the toenail falloff he denies any redness or drainage on the toenail. Denies any pain to the toe. He has had no prior treatment.  Review of Systems  Constitutional: Positive for fatigue.  HENT:       Hearing loss  Musculoskeletal: Positive for gait problem.  Skin: Positive for rash.  Neurological: Positive for weakness.  Hematological: Bruises/bleeds easily.  Psychiatric/Behavioral: Positive for confusion.  All other systems reviewed and are negative.      Objective:   Physical Exam AAO x3, NAD DP/PT pulses palpable bilaterally, CRT less than 3 seconds Protective sensation intact with Simms Weinstein monofilament, vibratory sensation intact, Achilles tendon reflex intact Bilateral hallux nails are absent and there is a small spicule of nail within the nail borders. There is no surrounding erythema or drainage on the actual nail sites. The left hallux are slight erythema and edema to the distal aspect of the hallux. There is a small scab along the distal aspect. Upon debridement there is a superficial collection of purulence to the area with underlying granular ulceration. There is no probe to bone, undermining, tunneling. No areas of fluctuance or crepitus. No malodor. There is a smaller scab on the distal aspect of the right hallux. Upon debridement no underlying ulceration of the skin is intact. There is no edema or erythema to the right hallux. Nails 2 through 5 bilaterally are dystrophic, brittle, thin. There is mild incurvation along the nails. No swelling erythema or drainage along the nail sites. There is some  tenderness to palpation overlying the nails 2 through 5 bilaterally. No areas of tenderness to bilateral lower extremities.  No open lesions or pre-ulcerative lesions.  No overlying edema, erythema, increase in warmth to bilateral lower extremities.  No pain with calf compression, swelling, warmth, erythema bilaterally.     Assessment & Plan:  Detailed male with hallux toenail onycholysis; superficial wound with localized infection left distal hallux; onychodystrophy -X-rays were obtained and reviewed with the patient. No definitive cortical destruction to suggest osteomyelitis at this time. No soft tissue emphysema. -Treatment options discussed including all alternatives, risks, and complications -Nails 2-5 bilaterally sharply debrided. A small amount of bleeding to the left 2nd digit toenail occurred due to the thinness of the nail. Area cleaned. Antibiotic ointment and band-aid applied.  -Lesions to the distal hallux sharply debrided. On the left there was an underlying ulceration is small amount of purulence. After debridement no further purulence is expressed. Iodosorb was applied followed by dry sterile dressing. Recommend daily dressing changes with atraumatic ointment and a bandage. -Rx Keflex -Monitor for any clinical signs or symptoms of infection and directed to call the office immediately should any occur or go to the ER. -Follow-up 2 weeks or sooner if any problems arise. In the meantime, encouraged to call the office with any questions, concerns, change in symptoms. Follow-up with PCP for other issues mentioned the the ROS.   Ovid Curd, DPM

## 2015-03-28 NOTE — Patient Instructions (Signed)
Continue antibiotic ointment and a bandage to the left big toenail and 2nd digit toenail. Start keflex Follow up in 2 weeks, or sooner if needed Monitor for any signs/symptoms of infection. Call the office immediately if any occur or go directly to the emergency room. Call with any questions/concerns.

## 2015-03-29 ENCOUNTER — Encounter: Payer: Self-pay | Admitting: Podiatry

## 2015-04-11 ENCOUNTER — Ambulatory Visit (INDEPENDENT_AMBULATORY_CARE_PROVIDER_SITE_OTHER): Payer: Medicare Other | Admitting: Podiatry

## 2015-04-11 ENCOUNTER — Encounter: Payer: Self-pay | Admitting: Podiatry

## 2015-04-11 VITALS — BP 111/54 | HR 76 | Resp 18

## 2015-04-11 DIAGNOSIS — L84 Corns and callosities: Secondary | ICD-10-CM

## 2015-04-12 ENCOUNTER — Encounter: Payer: Self-pay | Admitting: Podiatry

## 2015-04-12 NOTE — Progress Notes (Signed)
Patient ID: Terry Johnson, male   DOB: 1920-09-27, 81 y.o.   MRN: [id number]  Subjective: 81 year old male presents the operative his son for follow-up evaluation of left big toe ulceration as well as localized infection. Visit the area is healed nicely there is no longer any drainage coming from that wound there is no swelling or redness. He is able to wear shoes now any problems. Denies any systemic complaints as fevers, chills, nausea, vomiting. Denies calf pain, chest pain, shortness of breath. No other complaints at this time.  Objective: Awake, alert, NAD DP/PT pulses palpable, CRT less than 3 seconds Protective sensation intact with Simms Weinstein monofilament At the distal aspect of left hallux there is a small annular scab. Upon debridement there was no underlying ulceration, drainage/purulence. There is no surrounding erythema, ascending cellulitis, fluctuance, crepitus, malodor, edema.  There is no tenderness to palpation this time. No other areas of tenderness bilateral lower extremity is. There is no other open lesion or pre-ulcerative lesions identified at this time. No pain with calf compression, swelling, warmth, erythema.  Assessment: 81 year old male resolved left hallux infection, ulceration  Plan: Lesion of the distal aspect of the left hallux was debrided without complication/bleeding. No underlying ulceration or signs of infection at this time.Continue to monitor for any recurrence. At this any to call the office immediately. I'll see him back in 3 months for routine care or sooner if any problems are to arise. Call any questions, concerns, change in symptoms in the meantime.  Ovid Curd, DPM

## 2015-07-11 ENCOUNTER — Ambulatory Visit: Payer: Medicare Other | Admitting: Sports Medicine

## 2015-07-11 ENCOUNTER — Ambulatory Visit: Payer: Medicare Other

## 2016-10-07 ENCOUNTER — Emergency Department
Admission: EM | Admit: 2016-10-07 | Discharge: 2016-10-08 | Disposition: A | Discharge status: Home / Self Care | Payer: Medicare Other | Attending: Emergency Medicine | Admitting: Emergency Medicine

## 2016-10-07 ENCOUNTER — Emergency Department: Payer: Medicare Other

## 2016-10-07 ENCOUNTER — Encounter: Payer: Self-pay | Admitting: Emergency Medicine

## 2016-10-07 DIAGNOSIS — Z87891 Personal history of nicotine dependence: Secondary | ICD-10-CM | POA: Insufficient documentation

## 2016-10-07 DIAGNOSIS — Y9289 Other specified places as the place of occurrence of the external cause: Secondary | ICD-10-CM | POA: Diagnosis not present

## 2016-10-07 DIAGNOSIS — Z23 Encounter for immunization: Secondary | ICD-10-CM | POA: Insufficient documentation

## 2016-10-07 DIAGNOSIS — Z79899 Other long term (current) drug therapy: Secondary | ICD-10-CM | POA: Diagnosis not present

## 2016-10-07 DIAGNOSIS — Y999 Unspecified external cause status: Secondary | ICD-10-CM | POA: Diagnosis not present

## 2016-10-07 DIAGNOSIS — W19XXXA Unspecified fall, initial encounter: Secondary | ICD-10-CM

## 2016-10-07 DIAGNOSIS — W01198A Fall on same level from slipping, tripping and stumbling with subsequent striking against other object, initial encounter: Secondary | ICD-10-CM | POA: Insufficient documentation

## 2016-10-07 DIAGNOSIS — Y9301 Activity, walking, marching and hiking: Secondary | ICD-10-CM | POA: Diagnosis not present

## 2016-10-07 DIAGNOSIS — I10 Essential (primary) hypertension: Secondary | ICD-10-CM | POA: Insufficient documentation

## 2016-10-07 DIAGNOSIS — S0990XA Unspecified injury of head, initial encounter: Secondary | ICD-10-CM | POA: Diagnosis present

## 2016-10-07 DIAGNOSIS — S0101XA Laceration without foreign body of scalp, initial encounter: Secondary | ICD-10-CM | POA: Diagnosis not present

## 2016-10-07 DIAGNOSIS — J449 Chronic obstructive pulmonary disease, unspecified: Secondary | ICD-10-CM | POA: Diagnosis not present

## 2016-10-07 DIAGNOSIS — Z7982 Long term (current) use of aspirin: Secondary | ICD-10-CM | POA: Insufficient documentation

## 2016-10-07 HISTORY — DX: Chronic obstructive pulmonary disease, unspecified: J44.9

## 2016-10-07 MED ORDER — LIDOCAINE HCL (PF) 1 % IJ SOLN
INTRAMUSCULAR | Status: AC
  Filled 2016-10-07: qty 5

## 2016-10-07 MED ORDER — BACITRACIN ZINC 500 UNIT/GM EX OINT
TOPICAL_OINTMENT | CUTANEOUS | Status: AC
  Filled 2016-10-07: qty 1.8

## 2016-10-07 MED ORDER — TETANUS-DIPHTH-ACELL PERTUSSIS 5-2.5-18.5 LF-MCG/0.5 IM SUSP
0.5000 mL | Freq: Once | INTRAMUSCULAR | Status: AC
  Administered 2016-10-07: 0.5 mL via INTRAMUSCULAR
  Filled 2016-10-07: qty 0.5

## 2016-10-07 NOTE — Discharge Instructions (Signed)
Keep wound clean and dry. You can shower but did not do not submerge the wound in standing water. He can clean it with peroxide and/or Neosporin. Have it checked in 2 days and the staples taken out in 7. Return for any further problems.

## 2016-10-07 NOTE — ED Notes (Signed)
Patient transported to CT 

## 2016-10-07 NOTE — ED Notes (Signed)
Patient's head and left elbow wound cleaned up and MD notified.

## 2016-10-07 NOTE — ED Triage Notes (Signed)
Per [facility] using his Terry Johnson and he fell while wearing socks.  EMS also says patient fell last week but did not seek tx at that time.  Pt has hx of dementia, hypertension, chronic back pain, and COPD.  Pt has approx. 1" lac on back of head and a skin teat on his left arm that was possibly from his other fall last week.  EMS states patient is NOT on blood thinners.  Bleeding is controlled and pt is calm and cooperative at triage.

## 2016-10-07 NOTE — ED Provider Notes (Signed)
[facility] Emergency Department Provider Note   ____________________________________________   First MD Initiated Contact with Patient 10/07/16 2020     (approximate)  I have reviewed the triage vital signs and the nursing notes.   HISTORY  Chief Complaint Fall   HPI Terry Johnson is a 83 y.o. male patient reports he was using his walker and slipped and fell while he was in his socks. He apparently also fell last week but did not come in at that time. Patient reportedly did not pass out. He does have a cut on the back of his head. Patient complains of neck pain and mild headache.   Past Medical History:  Diagnosis Date  . Anemia   . Anxiety   . Chronic pain   . COPD (chronic obstructive pulmonary disease) (HCC)   . Dementia   . Depression   . GERD (gastroesophageal reflux disease)   . Hypertension   . Pneumonitis   . Renal disorder     Patient Active Problem List   Diagnosis Date Noted  . Cellulitis of neck 01/02/2015  . HTN (hypertension) 01/02/2015  . Dementia 01/02/2015  . GERD (gastroesophageal reflux disease) 01/02/2015  . Depression 01/02/2015  . Anxiety 01/02/2015  . Sepsis (HCC) 01/02/2015    Past Surgical History:  Procedure Laterality Date  . CATARACT EXTRACTION    . HERNIA REPAIR    . HIP ARTHROPLASTY Right    with subsequent revision  . HIP ARTHROPLASTY Left   . LUMBAR LAMINECTOMY      Prior to Admission medications   Medication Sig Start Date End Date Taking? Authorizing Provider  acetaminophen (TYLENOL) 500 MG tablet Take 1,000 mg by mouth 2 (two) times daily as needed for mild pain or fever.    Historical Provider, MD  amiodarone (PACERONE) 200 MG tablet Take 200 mg by mouth 2 (two) times daily.    Historical Provider, MD  amLODipine (NORVASC) 10 MG tablet Take 10 mg by mouth at bedtime.    Historical Provider, MD  aspirin EC 81 MG tablet Take by mouth.    Historical Provider, MD  camphor-menthol Wynelle Fanny) lotion  Apply 1 application topically 2 (two) times daily as needed for itching.    Historical Provider, MD  cephALEXin (KEFLEX) 500 MG capsule Take 1 capsule (500 mg total) by mouth 2 (two) times daily. 03/28/15   Vivi Barrack, DPM  donepezil (ARICEPT) 10 MG tablet Take 10 mg by mouth at bedtime.    Historical Provider, MD  ferrous sulfate 325 (65 FE) MG tablet Take 325 mg by mouth 3 (three) times daily.    Historical Provider, MD  folic acid (FOLVITE) 1 MG tablet Take 1 mg by mouth every morning.    Historical Provider, MD  gabapentin (NEURONTIN) 100 MG capsule Take 100 mg by mouth every morning.    Historical Provider, MD  HYDROcodone-acetaminophen (NORCO/VICODIN) 5-325 MG per tablet Take 0.5 tablets by mouth every 8 (eight) hours as needed for moderate pain. 01/07/15   Ramonita Lab, MD  hydrOXYzine (ATARAX/VISTARIL) 10 MG tablet Take 10 mg by mouth at bedtime.    Historical Provider, MD  ipratropium (ATROVENT) 0.03 % nasal spray Place 2 sprays into both nostrils 2 (two) times daily.    Historical Provider, MD  magnesium hydroxide (MILK OF MAGNESIA) 400 MG/5ML suspension Take 30 mLs by mouth daily as needed for mild constipation.    Historical Provider, MD  Multiple Vitamins-Minerals (PRESERVISION AREDS PO) Take 1 capsule by mouth 2 (  two) times daily.    Historical Provider, MD  mupirocin ointment (BACTROBAN) 2 % Place 1 application into the nose 3 (three) times daily.    Historical Provider, MD  neomycin-bacitracin-polymyxin (NEOSPORIN) ointment Apply 1 application topically 2 (two) times daily. apply to eye    Historical Provider, MD  OLANZapine (ZYPREXA) 5 MG tablet Take 5 mg by mouth every morning.    Historical Provider, MD  pantoprazole (PROTONIX) 40 MG tablet Take 40 mg by mouth every morning.    Historical Provider, MD  Pramoxine-Calamine (AVEENO ANTI-ITCH EX) Apply 1 application topically at bedtime.    Historical Provider, MD  traMADol (ULTRAM) 50 MG tablet Take 1 tablet (50 mg total) by mouth  every 12 (twelve) hours. 01/07/15   Ramonita LabAruna Gouru, MD  traZODone (DESYREL) 50 MG tablet Take 25 mg by mouth at bedtime.    Historical Provider, MD  triamcinolone (KENALOG) 0.025 % cream Apply 1 application topically every morning. Pt mixes with Cerave.  (1:1 mixture)    Historical Provider, MD  venlafaxine XR (EFFEXOR-XR) 150 MG 24 hr capsule Take 150 mg by mouth every morning.    Historical Provider, MD  vitamin B-12 (CYANOCOBALAMIN) 1000 MCG tablet Take 1,000 mcg by mouth every morning.    Historical Provider, MD    Allergies Ace inhibitors; Codeine; Morphine; and Ms contin [morphine sulfate er beads]  Family History  Problem Relation Age of Onset  . Lung cancer Father   . Diabetes Father   . Arthritis/Rheumatoid Father   . Lung cancer Brother     Social History Social History  Substance Use Topics  . Smoking status: Former Games developermoker  . Smokeless tobacco: Never Used  . Alcohol use No    Review of Systems Constitutional: No fever/chills Eyes: No visual changes. ENT: No sore throat. Cardiovascular: Denies chest pain. Respiratory: Denies shortness of breath. Gastrointestinal: No abdominal pain.  No nausea, no vomiting.  No diarrhea.  No constipation. Genitourinary: Negative for dysuria. Musculoskeletal: Negative for back pain. Skin: Negative for rash. Neurological: Negative for focal weakness or numbness.  10-point ROS otherwise negative.  ____________________________________________   PHYSICAL EXAM:  VITAL SIGNS: ED Triage Vitals  Enc Vitals Group     BP 10/07/16 2013 (!) 157/77     Pulse Rate 10/07/16 2013 94     Resp 10/07/16 2013 14     Temp 10/07/16 2013 98.9 F (37.2 C)     Temp src --      SpO2 10/07/16 2009 98 %     Weight --      Height --      Head Circumference --      Peak Flow --      Pain Score --      Pain Loc --      Pain Edu? --      Excl. in GC? --     Constitutional: Alert and oriented. Well appearing and in no acute distress. Eyes:  Conjunctivae are normal. PERRL. EOMI. Head: Atraumatic   . Nose: No congestion/rhinnorhea. Mouth/Throat: Mucous membranes are moist.  Oropharynx non-erythematous. Neck: No stridor.  Cardiovascular: Normal rate, regular rhythm. Grossly normal heart sounds.  Good peripheral circulation. Respiratory: Normal respiratory effort.  No retractions. Lungs CTAB. Gastrointestinal: Soft and nontender. No distention. No abdominal bruits. No CVA tenderness. Musculoskeletal: No lower extremity tenderness nor edema.  No joint effusions. Neurologic:  Normal speech and language. No gross focal neurologic deficits are appreciated. No gait instability. Skin:  Skin is warm, dry and  intact. No rash noted. Psychiatric: Mood and affect are normal. Speech and behavior are normal.  ____________________________________________   LABS (all labs ordered are listed, but only abnormal results are displayed)  Labs Reviewed - No data to display ____________________________________________  EKG   ____________________________________________  RADIOLOGY Study Result   CLINICAL DATA:  Fell and hit head, complaining of neck pain  EXAM: CT HEAD WITHOUT CONTRAST  CT CERVICAL SPINE WITHOUT CONTRAST  TECHNIQUE: Multidetector CT imaging of the head and cervical spine was performed following the standard protocol without intravenous contrast. Multiplanar CT image reconstructions of the cervical spine were also generated.  COMPARISON:  01/05/2015, 01/07/2014  FINDINGS: CT HEAD FINDINGS  Brain: No evidence of acute infarction, hemorrhage, hydrocephalus, extra-axial collection or mass lesion/mass effect. Moderate atrophy. Mild periventricular white matter hypodensity consistent with small vessel changes.  Vascular: No hyperdense vessels.  Carotid artery calcifications.  Skull: No skull fracture.  No suspicious bone lesion.  Sinuses/Orbits: Mild mucosal thickening within the ethmoids  and maxillary sinuses. Postsurgical changes of right maxillary sinus. No acute orbital abnormality. Bilateral lens extraction.  Other: Small right posterior scalp hematoma.  CT CERVICAL SPINE FINDINGS  Alignment: Straightening of the cervical spine. No subluxation. Facet alignment is maintained.  Skull base and vertebrae: Craniovertebral junction appears intact. Vertebral body heights are normal. There is no fracture identified.  Soft tissues and spinal canal: No prevertebral fluid or swelling. No visible canal hematoma.  Disc levels: Moderate severe narrowing at C5-C6 and C6-C7 with endplate changes and anterior and posterior osteophyte. Moderate narrowing at C4-C5. Multilevel bilateral hypertrophic facet arthropathy. Multiple bilateral neural foraminal stenosis, most marked at C5-C6.  Upper chest: Emphysematous disease with scarring at the left apex. Heterogenous thyroid gland. 1.8 x 1.3 cm left infra auricular mass, anterior to left sternocleidomastoid muscles.  Other: Carotid artery calcifications.  IMPRESSION: 1. No CT evidence for acute intracranial abnormality. Small right posterior scalp swelling 2. Moderate severe multilevel degenerative disc changes. No acute fracture or malalignment 3. Emphysematous disease in the apices 4. 1.8 cm oval mass in the left neck, could represent a prominent lymph node. Clinical correlation is recommended.   Electronically Signed   By: Jasmine Pang M.D.   On: 10/07/2016 21:30      ____________________________________________   PROCEDURES  Procedure(s) performed:Wound anesthetized with 1% lidocaine cleaned with Betadine and irrigated with normal saline no foreign bodies were felt CT showed no foreign bodies either wound was closed with 3 staples. Was 1 cm long. Quickly on the vertex.  Procedures  Critical Care performed:   ____________________________________________   INITIAL IMPRESSION / ASSESSMENT AND  PLAN / ED COURSE  Pertinent labs & imaging results that were available during my care of the patient were reviewed by me and considered in my medical decision making (see chart for details).        ____________________________________________   FINAL CLINICAL IMPRESSION(S) / ED DIAGNOSES  Final diagnoses:  Fall, initial encounter  Laceration of scalp, initial encounter      NEW MEDICATIONS STARTED DURING THIS VISIT:  Discharge Medication List as of 10/07/2016 11:34 PM       Note:  This document was prepared using Dragon voice recognition software and may include unintentional dictation errors.    Arnaldo Natal, MD 10/08/16 [phone]

## 2016-10-26 ENCOUNTER — Encounter (HOSPITAL_COMMUNITY): Payer: Self-pay | Admitting: Emergency Medicine

## 2016-10-26 ENCOUNTER — Encounter: Payer: Self-pay | Admitting: Emergency Medicine

## 2016-10-26 ENCOUNTER — Inpatient Hospital Stay (HOSPITAL_COMMUNITY)
Admission: EM | Admit: 2016-10-26 | Discharge: 2016-10-27 | DRG: 084 | Disposition: A | Discharge status: Home / Self Care | Payer: Medicare Other | Attending: Neurology | Admitting: Neurology

## 2016-10-26 ENCOUNTER — Emergency Department: Payer: Medicare Other

## 2016-10-26 ENCOUNTER — Emergency Department
Admission: EM | Admit: 2016-10-26 | Discharge: 2016-10-26 | Payer: Medicare Other | Attending: Emergency Medicine | Admitting: Emergency Medicine

## 2016-10-26 DIAGNOSIS — W19XXXA Unspecified fall, initial encounter: Secondary | ICD-10-CM | POA: Diagnosis present

## 2016-10-26 DIAGNOSIS — S065XAA Traumatic subdural hemorrhage with loss of consciousness status unknown, initial encounter: Secondary | ICD-10-CM

## 2016-10-26 DIAGNOSIS — I10 Essential (primary) hypertension: Secondary | ICD-10-CM | POA: Insufficient documentation

## 2016-10-26 DIAGNOSIS — Y92099 Unspecified place in other non-institutional residence as the place of occurrence of the external cause: Secondary | ICD-10-CM

## 2016-10-26 DIAGNOSIS — S0101XA Laceration without foreign body of scalp, initial encounter: Secondary | ICD-10-CM

## 2016-10-26 DIAGNOSIS — S065X9A Traumatic subdural hemorrhage with loss of consciousness of unspecified duration, initial encounter: Secondary | ICD-10-CM | POA: Diagnosis not present

## 2016-10-26 DIAGNOSIS — Z79899 Other long term (current) drug therapy: Secondary | ICD-10-CM | POA: Insufficient documentation

## 2016-10-26 DIAGNOSIS — S0990XA Unspecified injury of head, initial encounter: Secondary | ICD-10-CM | POA: Diagnosis present

## 2016-10-26 DIAGNOSIS — G8929 Other chronic pain: Secondary | ICD-10-CM | POA: Diagnosis present

## 2016-10-26 DIAGNOSIS — F0390 Unspecified dementia without behavioral disturbance: Secondary | ICD-10-CM | POA: Diagnosis present

## 2016-10-26 DIAGNOSIS — Y929 Unspecified place or not applicable: Secondary | ICD-10-CM | POA: Diagnosis not present

## 2016-10-26 DIAGNOSIS — Z87891 Personal history of nicotine dependence: Secondary | ICD-10-CM | POA: Diagnosis not present

## 2016-10-26 DIAGNOSIS — Y939 Activity, unspecified: Secondary | ICD-10-CM | POA: Diagnosis not present

## 2016-10-26 DIAGNOSIS — D72829 Elevated white blood cell count, unspecified: Secondary | ICD-10-CM | POA: Diagnosis present

## 2016-10-26 DIAGNOSIS — Z833 Family history of diabetes mellitus: Secondary | ICD-10-CM | POA: Diagnosis not present

## 2016-10-26 DIAGNOSIS — Z801 Family history of malignant neoplasm of trachea, bronchus and lung: Secondary | ICD-10-CM

## 2016-10-26 DIAGNOSIS — F329 Major depressive disorder, single episode, unspecified: Secondary | ICD-10-CM | POA: Diagnosis present

## 2016-10-26 DIAGNOSIS — D649 Anemia, unspecified: Secondary | ICD-10-CM | POA: Diagnosis present

## 2016-10-26 DIAGNOSIS — S065X0A Traumatic subdural hemorrhage without loss of consciousness, initial encounter: Secondary | ICD-10-CM | POA: Diagnosis not present

## 2016-10-26 DIAGNOSIS — Y999 Unspecified external cause status: Secondary | ICD-10-CM | POA: Diagnosis not present

## 2016-10-26 DIAGNOSIS — Z7982 Long term (current) use of aspirin: Secondary | ICD-10-CM | POA: Diagnosis not present

## 2016-10-26 DIAGNOSIS — K219 Gastro-esophageal reflux disease without esophagitis: Secondary | ICD-10-CM | POA: Diagnosis present

## 2016-10-26 DIAGNOSIS — I639 Cerebral infarction, unspecified: Secondary | ICD-10-CM

## 2016-10-26 DIAGNOSIS — Z96643 Presence of artificial hip joint, bilateral: Secondary | ICD-10-CM | POA: Diagnosis present

## 2016-10-26 DIAGNOSIS — J449 Chronic obstructive pulmonary disease, unspecified: Secondary | ICD-10-CM | POA: Insufficient documentation

## 2016-10-26 DIAGNOSIS — F419 Anxiety disorder, unspecified: Secondary | ICD-10-CM | POA: Diagnosis present

## 2016-10-26 LAB — CBC WITH DIFFERENTIAL/PLATELET
BASOS ABS: 0.1 10*3/uL (ref 0–0.1)
BASOS PCT: 1 %
EOS ABS: 0.2 10*3/uL (ref 0–0.7)
EOS PCT: 1 %
HCT: 34.7 % — ABNORMAL LOW (ref 40.0–52.0)
Hemoglobin: 11.6 g/dL — ABNORMAL LOW (ref 13.0–18.0)
Lymphocytes Relative: 5 %
Lymphs Abs: 0.8 10*3/uL — ABNORMAL LOW (ref 1.0–3.6)
MCH: 29.4 pg (ref 26.0–34.0)
MCHC: 33.3 g/dL (ref 32.0–36.0)
MCV: 88.5 fL (ref 80.0–100.0)
MONO ABS: 1.1 10*3/uL — AB (ref 0.2–1.0)
Monocytes Relative: 7 %
Neutro Abs: 13.7 10*3/uL — ABNORMAL HIGH (ref 1.4–6.5)
Neutrophils Relative %: 86 %
PLATELETS: 284 10*3/uL (ref 150–440)
RBC: 3.93 MIL/uL — ABNORMAL LOW (ref 4.40–5.90)
RDW: 16.2 % — AB (ref 11.5–14.5)
WBC: 15.9 10*3/uL — ABNORMAL HIGH (ref 3.8–10.6)

## 2016-10-26 LAB — COMPREHENSIVE METABOLIC PANEL
ALBUMIN: 3.8 g/dL (ref 3.5–5.0)
ALT: 9 U/L — ABNORMAL LOW (ref 17–63)
AST: 17 U/L (ref 15–41)
Alkaline Phosphatase: 83 U/L (ref 38–126)
Anion gap: 7 (ref 5–15)
BUN: 23 mg/dL — ABNORMAL HIGH (ref 6–20)
CHLORIDE: 102 mmol/L (ref 101–111)
CO2: 28 mmol/L (ref 22–32)
Calcium: 8.8 mg/dL — ABNORMAL LOW (ref 8.9–10.3)
Creatinine, Ser: 1.48 mg/dL — ABNORMAL HIGH (ref 0.61–1.24)
GFR calc Af Amer: 45 mL/min — ABNORMAL LOW (ref >60)
GFR, EST NON AFRICAN AMERICAN: 39 mL/min — AB (ref >60)
Glucose, Bld: 112 mg/dL — ABNORMAL HIGH (ref 65–99)
POTASSIUM: 3.6 mmol/L (ref 3.5–5.1)
Sodium: 137 mmol/L (ref 135–145)
Total Bilirubin: 0.5 mg/dL (ref 0.3–1.2)
Total Protein: 6.8 g/dL (ref 6.5–8.1)

## 2016-10-26 LAB — PROTIME-INR
INR: 1.03
PROTHROMBIN TIME: 13.5 s (ref 11.4–15.2)

## 2016-10-26 LAB — APTT: aPTT: 36 seconds (ref 24–36)

## 2016-10-26 MED ORDER — LIDOCAINE HCL (PF) 1 % IJ SOLN
INTRAMUSCULAR | Status: AC
  Filled 2016-10-26: qty 5

## 2016-10-26 NOTE — ED Triage Notes (Signed)
Terry Johnson resident transferred from [place] for a un witness fall and confirmed head bleed

## 2016-10-26 NOTE — ED Provider Notes (Signed)
Time Seen: Approximately 1958  I have reviewed the triage notes  Chief Complaint: Fall   History of Present Illness: Terry Johnson is a 83 y.o. male *who presents after an unwitnessed fall today. Patient's had a history of recent falls over the last several weeks. He was seen and evaluated here on February 18 for a fall. It is at his baseline mental status according to the family. He does not appear to have any new mental status changes and has baseline dementia. There was no obvious discomfort anywhere else and presents with a posterior occipital head movement.   Past Medical History:  Diagnosis Date  . Anemia   . Anxiety   . Chronic pain   . COPD (chronic obstructive pulmonary disease) (HCC)   . Dementia   . Depression   . GERD (gastroesophageal reflux disease)   . Hypertension   . Pneumonitis   . Pneumonitis   . Renal disorder     Patient Active Problem List   Diagnosis Date Noted  . Cellulitis of neck 01/02/2015  . HTN (hypertension) 01/02/2015  . Dementia 01/02/2015  . GERD (gastroesophageal reflux disease) 01/02/2015  . Depression 01/02/2015  . Anxiety 01/02/2015  . Sepsis (HCC) 01/02/2015    Past Surgical History:  Procedure Laterality Date  . CATARACT EXTRACTION    . HERNIA REPAIR    . HIP ARTHROPLASTY Right    with subsequent revision  . HIP ARTHROPLASTY Left   . LUMBAR LAMINECTOMY      Past Surgical History:  Procedure Laterality Date  . CATARACT EXTRACTION    . HERNIA REPAIR    . HIP ARTHROPLASTY Right    with subsequent revision  . HIP ARTHROPLASTY Left   . LUMBAR LAMINECTOMY      Current Outpatient Rx  . Order #: [id number] Class: Historical Med  . Order #: [id number] Class: Historical Med  . Order #: [id number] Class: Historical Med  . Order #: [id number] Class: Historical Med  . Order #: [id number] Class: Historical Med  . Order #: [id number] Class: Print  . Order #: [id number] Class: Historical Med  . Order #: [id number] Class: Historical Med  .  Order #: [id number] Class: Historical Med  . Order #: [id number] Class: Historical Med  . Order #: [id number] Class: Print  . Order #: [id number] Class: Historical Med  . Order #: [id number] Class: Historical Med  . Order #: [id number] Class: Historical Med  . Order #: [id number] Class: Historical Med  . Order #: [id number] Class: Historical Med  . Order #: [id number] Class: Historical Med  . Order #: [id number] Class: Historical Med  . Order #: [id number] Class: Historical Med  . Order #: [id number] Class: Historical Med  . Order #: [id number] Class: Print  . Order #: [id number] Class: Historical Med  . Order #: [id number] Class: Historical Med  . Order #: [id number] Class: Historical Med  . Order #: [id number] Class: Historical Med    Allergies:  Ace inhibitors; Codeine; Morphine; and Ms contin [morphine sulfate er beads]  Family History: Family History  Problem Relation Age of Onset  . Lung cancer Father   . Diabetes Father   . Arthritis/Rheumatoid Father   . Lung cancer Brother     Social History: Social History  Substance Use Topics  . Smoking status: Former Games developermoker  . Smokeless tobacco: Never Used  . Alcohol use No     Review of Systems:   10 point review of systems was performed and was otherwise negative: No obvious syncopal episode Constitutional: No fever Eyes: No visual disturbances ENT: No sore throat, ear pain Cardiac: No chest pain Respiratory: No  shortness of breath, wheezing, or stridor Abdomen: No abdominal pain, no vomiting, No diarrhea Endocrine: No weight loss, No night sweats Extremities: No peripheral edema, cyanosis Skin: No rashes, easy bruising Neurologic: No focal weakness, trouble with speech or swollowing Urologic: No dysuria, Hematuria, or urinary frequency No obvious pain over the cervical, thoracic, lumbar spine region  Physical Exam:  ED Triage Vitals  Enc Vitals Group     BP 10/26/16 1927 (!) 164/83     Pulse Rate 10/26/16 1931 89     Resp 10/26/16 1927 18      Temp 10/26/16 1927 98 F (36.7 C)     Temp Source 10/26/16 1927 Oral     SpO2 10/26/16 1923 95 %     Weight 10/26/16 1927 150 lb (68 kg)     Height --      Head Circumference --      Peak Flow --      Pain Score --      Pain Loc --      Pain Edu? --      Excl. in GC? --     General: Awake , Alert , and Oriented times to name only does follow commands Head: Posterior occipital laceration area and no crepitus or step-off noted Eyes: Pupils equal , round, reactive to light Nose/Throat: No nasal drainage, patent upper airway without erythema or exudate.  Neck: Supple, Full range of motion, No anterior adenopathy or palpable thyroid masses. Good flexion extension I obvious pain or neuropraxia Lungs: Clear to ascultation without wheezes , rhonchi, or rales Heart: Regular rate, regular rhythm without murmurs , gallops , or rubs Abdomen: Soft, non tender without rebound, guarding , or rigidity; bowel sounds positive and symmetric in all 4 quadrants. No organomegaly .        Extremities: 2 plus symmetric pulses. No edema, clubbing or cyanosis Neurologic: normal ambulation, Motor symmetric without deficits, sensory intact Skin: warm, dry, no rashes   Labs:   All laboratory work was reviewed including any pertinent negatives or positives listed below:  Labs Reviewed  CBC WITH DIFFERENTIAL/PLATELET  COMPREHENSIVE METABOLIC PANEL  PROTIME-INR  APTT  Labs are pending   Radiology:  "Ct Head Wo Contrast  Result Date: 10/26/2016 CLINICAL DATA:  83 year old male with unwitnessed fall and head and neck injury. Initial encounter. EXAM: CT HEAD WITHOUT CONTRAST CT CERVICAL SPINE WITHOUT CONTRAST TECHNIQUE: Multidetector CT imaging of the head and cervical spine was performed following the standard protocol without intravenous contrast. Multiplanar CT image reconstructions of the cervical spine were also generated. COMPARISON:  10/07/2016 and prior CTs FINDINGS: CT HEAD FINDINGS Brain: Small  right anterior parafalcine subdural hematomas noted, measuring 3 mm in greatest diameter. There is no evidence of acute infarction, midline shift, hydrocephalus, mass lesion or other areas of hemorrhage. Atrophy and chronic small-vessel white matter ischemic changes are again noted. Vascular: Intracranial atherosclerotic calcifications noted. Skull: Normal. Negative for fracture or focal lesion. Sinuses/Orbits: No acute finding. Other: Posterior scalp soft tissue swelling identified. CT CERVICAL SPINE FINDINGS Alignment: No acute subluxation. 2 mm anterolisthesis of C4 on C5 is unchanged. Skull base and vertebrae: No acute fracture or focal bony lesion. Soft tissues and spinal canal: No prevertebral fluid or swelling. No visible canal hematoma. Disc levels: Moderate degenerative disc disease and spondylosis at C5-C6 noted. Mild multilevel degenerative disc disease and mild to moderate facet arthropathy within the remainder the cervical spine noted. Upper chest: Negative. Other: None IMPRESSION: Small right anterior parafalcine subdural hematomas, measuring  3 mm in greatest diameter. No midline shift or mass effect. Posterior scalp soft tissue swelling without fracture. No static evidence of acute injury to the cervical spine. Degenerative changes as described. Cerebral atrophy and chronic small-vessel white matter ischemic changes. Critical Value/emergent results were called by telephone at the time of interpretation on 10/26/2016 at 9:01 pm to Dr. Lacretia Nicks , who verbally acknowledged these results. Electronically Signed   By: Harmon Pier M.D.   On: 10/26/2016 20:02   Ct Head Wo Contrast  Result Date: 10/07/2016 CLINICAL DATA:  Larey Seat and hit head, complaining of neck pain EXAM: CT HEAD WITHOUT CONTRAST CT CERVICAL SPINE WITHOUT CONTRAST TECHNIQUE: Multidetector CT imaging of the head and cervical spine was performed following the standard protocol without intravenous contrast. Multiplanar CT image  reconstructions of the cervical spine were also generated. COMPARISON:  01/05/2015, 01/07/2014 FINDINGS: CT HEAD FINDINGS Brain: No evidence of acute infarction, hemorrhage, hydrocephalus, extra-axial collection or mass lesion/mass effect. Moderate atrophy. Mild periventricular white matter hypodensity consistent with small vessel changes. Vascular: No hyperdense vessels.  Carotid artery calcifications. Skull: No skull fracture.  No suspicious bone lesion. Sinuses/Orbits: Mild mucosal thickening within the ethmoids and maxillary sinuses. Postsurgical changes of right maxillary sinus. No acute orbital abnormality. Bilateral lens extraction. Other: Small right posterior scalp hematoma. CT CERVICAL SPINE FINDINGS Alignment: Straightening of the cervical spine. No subluxation. Facet alignment is maintained. Skull base and vertebrae: Craniovertebral junction appears intact. Vertebral body heights are normal. There is no fracture identified. Soft tissues and spinal canal: No prevertebral fluid or swelling. No visible canal hematoma. Disc levels: Moderate severe narrowing at C5-C6 and C6-C7 with endplate changes and anterior and posterior osteophyte. Moderate narrowing at C4-C5. Multilevel bilateral hypertrophic facet arthropathy. Multiple bilateral neural foraminal stenosis, most marked at C5-C6. Upper chest: Emphysematous disease with scarring at the left apex. Heterogenous thyroid gland. 1.8 x 1.3 cm left infra auricular mass, anterior to left sternocleidomastoid muscles. Other: Carotid artery calcifications. IMPRESSION: 1. No CT evidence for acute intracranial abnormality. Small right posterior scalp swelling 2. Moderate severe multilevel degenerative disc changes. No acute fracture or malalignment 3. Emphysematous disease in the apices 4. 1.8 cm oval mass in the left neck, could represent a prominent lymph node. Clinical correlation is recommended. Electronically Signed   By: Jasmine Pang M.D.   On: 10/07/2016 21:30    Ct Cervical Spine Wo Contrast  Result Date: 10/26/2016 CLINICAL DATA:  83 year old male with unwitnessed fall and head and neck injury. Initial encounter. EXAM: CT HEAD WITHOUT CONTRAST CT CERVICAL SPINE WITHOUT CONTRAST TECHNIQUE: Multidetector CT imaging of the head and cervical spine was performed following the standard protocol without intravenous contrast. Multiplanar CT image reconstructions of the cervical spine were also generated. COMPARISON:  10/07/2016 and prior CTs FINDINGS: CT HEAD FINDINGS Brain: Small right anterior parafalcine subdural hematomas noted, measuring 3 mm in greatest diameter. There is no evidence of acute infarction, midline shift, hydrocephalus, mass lesion or other areas of hemorrhage. Atrophy and chronic small-vessel white matter ischemic changes are again noted. Vascular: Intracranial atherosclerotic calcifications noted. Skull: Normal. Negative for fracture or focal lesion. Sinuses/Orbits: No acute finding. Other: Posterior scalp soft tissue swelling identified. CT CERVICAL SPINE FINDINGS Alignment: No acute subluxation. 2 mm anterolisthesis of C4 on C5 is unchanged. Skull base and vertebrae: No acute fracture or focal bony lesion. Soft tissues and spinal canal: No prevertebral fluid or swelling. No visible canal hematoma. Disc levels: Moderate degenerative disc disease and spondylosis at C5-C6 noted. Mild multilevel  degenerative disc disease and mild to moderate facet arthropathy within the remainder the cervical spine noted. Upper chest: Negative. Other: None IMPRESSION: Small right anterior parafalcine subdural hematomas, measuring 3 mm in greatest diameter. No midline shift or mass effect. Posterior scalp soft tissue swelling without fracture. No static evidence of acute injury to the cervical spine. Degenerative changes as described. Cerebral atrophy and chronic small-vessel white matter ischemic changes. Critical Value/emergent results were called by telephone at the  time of interpretation on 10/26/2016 at 9:01 pm to Dr. Lacretia Nicks , who verbally acknowledged these results. Electronically Signed   By: Harmon Pier M.D.   On: 10/26/2016 20:02   Ct Cervical Spine Wo Contrast  Result Date: 10/07/2016 CLINICAL DATA:  Larey Seat and hit head, complaining of neck pain EXAM: CT HEAD WITHOUT CONTRAST CT CERVICAL SPINE WITHOUT CONTRAST TECHNIQUE: Multidetector CT imaging of the head and cervical spine was performed following the standard protocol without intravenous contrast. Multiplanar CT image reconstructions of the cervical spine were also generated. COMPARISON:  01/05/2015, 01/07/2014 FINDINGS: CT HEAD FINDINGS Brain: No evidence of acute infarction, hemorrhage, hydrocephalus, extra-axial collection or mass lesion/mass effect. Moderate atrophy. Mild periventricular white matter hypodensity consistent with small vessel changes. Vascular: No hyperdense vessels.  Carotid artery calcifications. Skull: No skull fracture.  No suspicious bone lesion. Sinuses/Orbits: Mild mucosal thickening within the ethmoids and maxillary sinuses. Postsurgical changes of right maxillary sinus. No acute orbital abnormality. Bilateral lens extraction. Other: Small right posterior scalp hematoma. CT CERVICAL SPINE FINDINGS Alignment: Straightening of the cervical spine. No subluxation. Facet alignment is maintained. Skull base and vertebrae: Craniovertebral junction appears intact. Vertebral body heights are normal. There is no fracture identified. Soft tissues and spinal canal: No prevertebral fluid or swelling. No visible canal hematoma. Disc levels: Moderate severe narrowing at C5-C6 and C6-C7 with endplate changes and anterior and posterior osteophyte. Moderate narrowing at C4-C5. Multilevel bilateral hypertrophic facet arthropathy. Multiple bilateral neural foraminal stenosis, most marked at C5-C6. Upper chest: Emphysematous disease with scarring at the left apex. Heterogenous thyroid gland. 1.8 x 1.3 cm  left infra auricular mass, anterior to left sternocleidomastoid muscles. Other: Carotid artery calcifications. IMPRESSION: 1. No CT evidence for acute intracranial abnormality. Small right posterior scalp swelling 2. Moderate severe multilevel degenerative disc changes. No acute fracture or malalignment 3. Emphysematous disease in the apices 4. 1.8 cm oval mass in the left neck, could represent a prominent lymph node. Clinical correlation is recommended. Electronically Signed   By: Jasmine Pang M.D.   On: 10/07/2016 21:30  "  I personally reviewed the radiologic studies CT review shows what appears to be new small subdural hematomas without shift.  Procedures: Patient has a 3 cm jagged laceration to the scalp. No deep involvement. Wound does not penetrate through the g[place]a Please scalp area was prepped and draped in sterile fashion and anesthetized locally with 1% lidocaine without epinephrine. 3 interrupted staples were established for adequate wound alignment. Patient tolerated the procedure well.    ED Course: Due to the patient's subdural hematoma so small and not clinically evident. Require observation. We are currently attempting to contact the neural hospitalist program at [facility] for transfer. Patient will have routine laboratory work performed.  Assessment:  Small subdural hematomas Scalp laceration simple closure 3 cm     Plan:  Transferred [place]            Jennye Moccasin, MD 10/26/16 2133

## 2016-10-26 NOTE — ED Provider Notes (Addendum)
[facility]-EMERGENCY DEPT Provider Note   CSN: [id number] Arrival date & time: 10/26/16  2255   By signing my name below, I, Soijett Blue, attest that this documentation has been prepared under the direction and in the presence of Shon Baton, MD. Electronically Signed: Soijett Blue, ED Scribe. 10/26/16. 11:37 PM.  History   Chief Complaint Chief Complaint  Patient presents with  . Fall  . Head Injury     LEVEL 5 CAVEAT: DEMENTIA  HPI Terry Johnson is a 83 y.o. male with a PMHx of dementia, HTN, who presents to the Emergency Department brought in by EMS from [facility] complaining of fall onset earlier today. Per EMS report, pt had an unwitnessed fall at [facility] and was evaluated at [facility]. Pt was sent to [facility] for further evaluation due to an abnormal CT head finding of subdural hematoma while at [facility]. Pt has associated laceration to posterior head. Pt had a bandage applied to his head to apply pressure without medications for the relief of his symptoms. EMS denies the pt having any other symptoms.  Patient accepted by Dr. Otelia Limes, neurology in transfer.  The history is provided by the EMS personnel and medical records. No language interpreter was used.    Past Medical History:  Diagnosis Date  . Anemia   . Anxiety   . Chronic pain   . COPD (chronic obstructive pulmonary disease) (HCC)   . Dementia   . Depression   . GERD (gastroesophageal reflux disease)   . Hypertension   . Pneumonitis   . Pneumonitis   . Renal disorder     Patient Active Problem List   Diagnosis Date Noted  . Cellulitis of neck 01/02/2015  . HTN (hypertension) 01/02/2015  . Dementia 01/02/2015  . GERD (gastroesophageal reflux disease) 01/02/2015  . Depression 01/02/2015  . Anxiety 01/02/2015  . Sepsis (HCC) 01/02/2015    Past Surgical History:  Procedure Laterality Date  . CATARACT EXTRACTION    . HERNIA REPAIR    . HIP ARTHROPLASTY Right    with subsequent revision  . HIP  ARTHROPLASTY Left   . LUMBAR LAMINECTOMY         Home Medications    Prior to Admission medications   Medication Sig Start Date End Date Taking? Authorizing Provider  acetaminophen (TYLENOL) 500 MG tablet Take 1,000 mg by mouth 2 (two) times daily as needed for mild pain or fever.    Historical Provider, MD  amiodarone (PACERONE) 200 MG tablet Take 200 mg by mouth 2 (two) times daily.    Historical Provider, MD  amLODipine (NORVASC) 10 MG tablet Take 10 mg by mouth at bedtime.    Historical Provider, MD  aspirin EC 81 MG tablet Take by mouth.    Historical Provider, MD  camphor-menthol Wynelle Fanny) lotion Apply 1 application topically 2 (two) times daily as needed for itching.    Historical Provider, MD  cephALEXin (KEFLEX) 500 MG capsule Take 1 capsule (500 mg total) by mouth 2 (two) times daily. 03/28/15   Vivi Barrack, DPM  donepezil (ARICEPT) 10 MG tablet Take 10 mg by mouth at bedtime.    Historical Provider, MD  ferrous sulfate 325 (65 FE) MG tablet Take 325 mg by mouth 3 (three) times daily.    Historical Provider, MD  folic acid (FOLVITE) 1 MG tablet Take 1 mg by mouth every morning.    Historical Provider, MD  gabapentin (NEURONTIN) 100 MG capsule Take 100 mg by mouth every morning.  Historical Provider, MD  HYDROcodone-acetaminophen (NORCO/VICODIN) 5-325 MG per tablet Take 0.5 tablets by mouth every 8 (eight) hours as needed for moderate pain. 01/07/15   Ramonita Lab, MD  hydrOXYzine (ATARAX/VISTARIL) 10 MG tablet Take 10 mg by mouth at bedtime.    Historical Provider, MD  ipratropium (ATROVENT) 0.03 % nasal spray Place 2 sprays into both nostrils 2 (two) times daily.    Historical Provider, MD  magnesium hydroxide (MILK OF MAGNESIA) 400 MG/5ML suspension Take 30 mLs by mouth daily as needed for mild constipation.    Historical Provider, MD  Multiple Vitamins-Minerals (PRESERVISION AREDS PO) Take 1 capsule by mouth 2 (two) times daily.    Historical Provider, MD  mupirocin ointment  (BACTROBAN) 2 % Place 1 application into the nose 3 (three) times daily.    Historical Provider, MD  neomycin-bacitracin-polymyxin (NEOSPORIN) ointment Apply 1 application topically 2 (two) times daily. apply to eye    Historical Provider, MD  OLANZapine (ZYPREXA) 5 MG tablet Take 5 mg by mouth every morning.    Historical Provider, MD  pantoprazole (PROTONIX) 40 MG tablet Take 40 mg by mouth every morning.    Historical Provider, MD  Pramoxine-Calamine (AVEENO ANTI-ITCH EX) Apply 1 application topically at bedtime.    Historical Provider, MD  traMADol (ULTRAM) 50 MG tablet Take 1 tablet (50 mg total) by mouth every 12 (twelve) hours. 01/07/15   Ramonita Lab, MD  traZODone (DESYREL) 50 MG tablet Take 25 mg by mouth at bedtime.    Historical Provider, MD  triamcinolone (KENALOG) 0.025 % cream Apply 1 application topically every morning. Pt mixes with Cerave.  (1:1 mixture)    Historical Provider, MD  venlafaxine XR (EFFEXOR-XR) 150 MG 24 hr capsule Take 150 mg by mouth every morning.    Historical Provider, MD  vitamin B-12 (CYANOCOBALAMIN) 1000 MCG tablet Take 1,000 mcg by mouth every morning.    Historical Provider, MD    Family History Family History  Problem Relation Age of Onset  . Lung cancer Father   . Diabetes Father   . Arthritis/Rheumatoid Father   . Lung cancer Brother     Social History Social History  Substance Use Topics  . Smoking status: Former Games developer  . Smokeless tobacco: Never Used  . Alcohol use No     Allergies   Ace inhibitors; Codeine; Morphine; and Ms contin [morphine sulfate er beads]   Review of Systems Review of Systems  Unable to perform ROS: Dementia     Physical Exam Updated Vital Signs BP 154/88   Pulse 110   Temp 97.9 F (36.6 C) (Axillary)   Resp 20   SpO2 97%   Physical Exam  Constitutional:  Elderly, no acute distress  HENT:  Head: Normocephalic.  3-4 similar laceration over the posterior scalp, 3 staples in place with mild oozing  noted gaping at the lateral aspect of the wound  Eyes: Pupils are equal, round, and reactive to light.  Cardiovascular: Normal rate, regular rhythm and normal heart sounds.   No murmur heard. Pulmonary/Chest: Effort normal and breath sounds normal. No respiratory distress.  Abdominal: Soft. Bowel sounds are normal. There is no tenderness.  Musculoskeletal: He exhibits no edema.  Neurological: He is alert.  Disoriented, intermittently agitated, moves all 4 extremities without difficulty  Skin: Skin is warm and dry.  See laceration above Skin tear right lower leg  Psychiatric:  Intermittently agitated  Nursing note and vitals reviewed.    ED Treatments / Results  DIAGNOSTIC STUDIES: Oxygen  Saturation is 97% on RA, nl by my interpretation.    COORDINATION OF CARE: 11:26 PM Discussed treatment plan bedside.   Labs (all labs ordered are listed, but only abnormal results are displayed) Labs Reviewed - No data to display  EKG  EKG Interpretation None       Radiology Ct Head Wo Contrast  Result Date: 10/26/2016 CLINICAL DATA:  83 year old male with unwitnessed fall and head and neck injury. Initial encounter. EXAM: CT HEAD WITHOUT CONTRAST CT CERVICAL SPINE WITHOUT CONTRAST TECHNIQUE: Multidetector CT imaging of the head and cervical spine was performed following the standard protocol without intravenous contrast. Multiplanar CT image reconstructions of the cervical spine were also generated. COMPARISON:  10/07/2016 and prior CTs FINDINGS: CT HEAD FINDINGS Brain: Small right anterior parafalcine subdural hematomas noted, measuring 3 mm in greatest diameter. There is no evidence of acute infarction, midline shift, hydrocephalus, mass lesion or other areas of hemorrhage. Atrophy and chronic small-vessel white matter ischemic changes are again noted. Vascular: Intracranial atherosclerotic calcifications noted. Skull: Normal. Negative for fracture or focal lesion. Sinuses/Orbits: No acute  finding. Other: Posterior scalp soft tissue swelling identified. CT CERVICAL SPINE FINDINGS Alignment: No acute subluxation. 2 mm anterolisthesis of C4 on C5 is unchanged. Skull base and vertebrae: No acute fracture or focal bony lesion. Soft tissues and spinal canal: No prevertebral fluid or swelling. No visible canal hematoma. Disc levels: Moderate degenerative disc disease and spondylosis at C5-C6 noted. Mild multilevel degenerative disc disease and mild to moderate facet arthropathy within the remainder the cervical spine noted. Upper chest: Negative. Other: None IMPRESSION: Small right anterior parafalcine subdural hematomas, measuring 3 mm in greatest diameter. No midline shift or mass effect. Posterior scalp soft tissue swelling without fracture. No static evidence of acute injury to the cervical spine. Degenerative changes as described. Cerebral atrophy and chronic small-vessel white matter ischemic changes. Critical Value/emergent results were called by telephone at the time of interpretation on 10/26/2016 at 9:01 pm to Dr. Lacretia NicksBRIAN QUIGLEY , who verbally acknowledged these results. Electronically Signed   By: Harmon PierJeffrey  Hu M.D.   On: 10/26/2016 20:02   Ct Cervical Spine Wo Contrast  Result Date: 10/26/2016 CLINICAL DATA:  83 year old male with unwitnessed fall and head and neck injury. Initial encounter. EXAM: CT HEAD WITHOUT CONTRAST CT CERVICAL SPINE WITHOUT CONTRAST TECHNIQUE: Multidetector CT imaging of the head and cervical spine was performed following the standard protocol without intravenous contrast. Multiplanar CT image reconstructions of the cervical spine were also generated. COMPARISON:  10/07/2016 and prior CTs FINDINGS: CT HEAD FINDINGS Brain: Small right anterior parafalcine subdural hematomas noted, measuring 3 mm in greatest diameter. There is no evidence of acute infarction, midline shift, hydrocephalus, mass lesion or other areas of hemorrhage. Atrophy and chronic small-vessel white  matter ischemic changes are again noted. Vascular: Intracranial atherosclerotic calcifications noted. Skull: Normal. Negative for fracture or focal lesion. Sinuses/Orbits: No acute finding. Other: Posterior scalp soft tissue swelling identified. CT CERVICAL SPINE FINDINGS Alignment: No acute subluxation. 2 mm anterolisthesis of C4 on C5 is unchanged. Skull base and vertebrae: No acute fracture or focal bony lesion. Soft tissues and spinal canal: No prevertebral fluid or swelling. No visible canal hematoma. Disc levels: Moderate degenerative disc disease and spondylosis at C5-C6 noted. Mild multilevel degenerative disc disease and mild to moderate facet arthropathy within the remainder the cervical spine noted. Upper chest: Negative. Other: None IMPRESSION: Small right anterior parafalcine subdural hematomas, measuring 3 mm in greatest diameter. No midline shift or mass effect. Posterior  scalp soft tissue swelling without fracture. No static evidence of acute injury to the cervical spine. Degenerative changes as described. Cerebral atrophy and chronic small-vessel white matter ischemic changes. Critical Value/emergent results were called by telephone at the time of interpretation on 10/26/2016 at 9:01 pm to Dr. Lacretia Nicks , who verbally acknowledged these results. Electronically Signed   By: Harmon Pier M.D.   On: 10/26/2016 20:02    Procedures Procedures (including critical care time)  Medications Ordered in ED Medications - No data to display   Initial Impression / Assessment and Plan / ED Course  I have reviewed the triage vital signs and the nursing notes.  Pertinent labs & imaging results that were available during my care of the patient were reviewed by me and considered in my medical decision making (see chart for details).     Patient presents with reported subdural hematoma from [facility]. Their blood work was obtained. The laceration was repair with 3 staples. He has dementia and  is otherwise noncontributory to history taking.  He is generally agitated but otherwise his ABCs are intact. Discussed with accepting neurologist who states that he will evaluate patient and likely admit to the ICU.  1:50 AM Discussed with Dr. Otelia Limes.  He was under the impression that this patient was a spontaneous bleed. He does not admit traumatic bleed.   3:36 AM Patient is reportedly at his baseline. He is not on any anticoagulants. I discussed the patient with neurosurgery, Dr. Yetta Barre. He has reviewed the CT imaging. He does not recommend any further workup or observation at this time.  Final Clinical Impressions(s) / ED Diagnoses   Final diagnoses:  Subdural hematoma (HCC)  Laceration of scalp, initial encounter    New Prescriptions New Prescriptions   No medications on file   I personally performed the services described in this documentation, which was scribed in my presence. The recorded information has been reviewed and is accurate.     Shon Baton, MD 10/27/16 0001    Shon Baton, MD 10/27/16 [phone]

## 2016-10-26 NOTE — ED Notes (Signed)
1inch laceration to back of head s/p unwitnessed fall.

## 2016-10-26 NOTE — ED Triage Notes (Signed)
Per EMS report, patient had an unwitnessed fall. [facility] [place] [facility] and per CNA report to EMS frequently tries to walk without his walker. Patient has wound on the back of his head. Patient denies pain at this time.

## 2016-10-27 DIAGNOSIS — Z7982 Long term (current) use of aspirin: Secondary | ICD-10-CM | POA: Diagnosis not present

## 2016-10-27 DIAGNOSIS — I6200 Nontraumatic subdural hemorrhage, unspecified: Secondary | ICD-10-CM

## 2016-10-27 DIAGNOSIS — G8929 Other chronic pain: Secondary | ICD-10-CM | POA: Diagnosis present

## 2016-10-27 DIAGNOSIS — F419 Anxiety disorder, unspecified: Secondary | ICD-10-CM | POA: Diagnosis present

## 2016-10-27 DIAGNOSIS — S065X9A Traumatic subdural hemorrhage with loss of consciousness of unspecified duration, initial encounter: Secondary | ICD-10-CM | POA: Diagnosis present

## 2016-10-27 DIAGNOSIS — Z79899 Other long term (current) drug therapy: Secondary | ICD-10-CM | POA: Diagnosis not present

## 2016-10-27 DIAGNOSIS — K219 Gastro-esophageal reflux disease without esophagitis: Secondary | ICD-10-CM | POA: Diagnosis present

## 2016-10-27 DIAGNOSIS — Z96643 Presence of artificial hip joint, bilateral: Secondary | ICD-10-CM | POA: Diagnosis present

## 2016-10-27 DIAGNOSIS — D649 Anemia, unspecified: Secondary | ICD-10-CM | POA: Diagnosis present

## 2016-10-27 DIAGNOSIS — Z801 Family history of malignant neoplasm of trachea, bronchus and lung: Secondary | ICD-10-CM | POA: Diagnosis not present

## 2016-10-27 DIAGNOSIS — W19XXXA Unspecified fall, initial encounter: Secondary | ICD-10-CM | POA: Diagnosis present

## 2016-10-27 DIAGNOSIS — F329 Major depressive disorder, single episode, unspecified: Secondary | ICD-10-CM | POA: Diagnosis present

## 2016-10-27 DIAGNOSIS — D72829 Elevated white blood cell count, unspecified: Secondary | ICD-10-CM | POA: Diagnosis present

## 2016-10-27 DIAGNOSIS — Z87891 Personal history of nicotine dependence: Secondary | ICD-10-CM | POA: Diagnosis not present

## 2016-10-27 DIAGNOSIS — J449 Chronic obstructive pulmonary disease, unspecified: Secondary | ICD-10-CM | POA: Diagnosis present

## 2016-10-27 DIAGNOSIS — S0101XA Laceration without foreign body of scalp, initial encounter: Secondary | ICD-10-CM | POA: Diagnosis present

## 2016-10-27 DIAGNOSIS — F0390 Unspecified dementia without behavioral disturbance: Secondary | ICD-10-CM | POA: Diagnosis present

## 2016-10-27 DIAGNOSIS — I10 Essential (primary) hypertension: Secondary | ICD-10-CM | POA: Diagnosis present

## 2016-10-27 DIAGNOSIS — Z833 Family history of diabetes mellitus: Secondary | ICD-10-CM | POA: Diagnosis not present

## 2016-10-27 DIAGNOSIS — Y92099 Unspecified place in other non-institutional residence as the place of occurrence of the external cause: Secondary | ICD-10-CM | POA: Diagnosis not present

## 2016-10-27 DIAGNOSIS — I639 Cerebral infarction, unspecified: Secondary | ICD-10-CM | POA: Diagnosis present

## 2016-10-27 MED ORDER — LABETALOL HCL 5 MG/ML IV SOLN: 20.0000 mg | Freq: Once | INTRAVENOUS | Status: DC

## 2016-10-27 MED ORDER — PANTOPRAZOLE SODIUM 40 MG IV SOLR: 40.0000 mg | Freq: Every day | INTRAVENOUS | Status: DC

## 2016-10-27 MED ORDER — ACETAMINOPHEN 325 MG PO TABS: 650.0000 mg | ORAL_TABLET | ORAL | Status: DC | PRN

## 2016-10-27 MED ORDER — ACETAMINOPHEN 160 MG/5ML PO SOLN: 650.0000 mg | ORAL | Status: DC | PRN

## 2016-10-27 MED ORDER — ACETAMINOPHEN 650 MG RE SUPP: 650.0000 mg | RECTAL | Status: DC | PRN

## 2016-10-27 MED ORDER — SENNOSIDES-DOCUSATE SODIUM 8.6-50 MG PO TABS
1.0000 | ORAL_TABLET | Freq: Every evening | ORAL | Status: DC | PRN
  Filled 2016-10-27: qty 1

## 2016-10-27 MED ORDER — STROKE: EARLY STAGES OF RECOVERY BOOK
Freq: Once | Status: DC
  Filled 2016-10-27: qty 1

## 2016-10-27 MED ORDER — SODIUM CHLORIDE 0.9 % IV SOLN: INTRAVENOUS | Status: DC

## 2016-10-27 MED ORDER — CLEVIDIPINE BUTYRATE 0.5 MG/ML IV EMUL
0.0000 mg/h | INTRAVENOUS | Status: DC
  Filled 2016-10-27: qty 50

## 2016-10-27 NOTE — ED Notes (Signed)
Pt and family understood dc material. NAD noted 

## 2016-10-27 NOTE — Consult Note (Addendum)
NEURO HOSPITALIST CONSULT NOTE   Requestig physician: Dr. Wilkie Aye  Reason for Consult: Traumatic subdural hematoma  History obtained from:  Family and Chart     HPI:                                                                                                                                          Terry Johnson is an 83 y.o. male who was transferred to [facility] ED from [facility] ED for further evaluation of traumatic parafalcine subdural hematomas. He was brought in by EMS from [facility] after an unwitnessed fall on Saturday. The patient had an associated laceration to his posterior scalp, with a bandage applied to his head to apply pressure without medications for the relief of his symptoms. EMS denied the pt having any other symptoms.    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   Past Medical History:  Diagnosis Date  . Anemia   . Anxiety   . Chronic pain   . COPD (chronic obstructive pulmonary disease) (HCC)   . Dementia   . Depression   . GERD (gastroesophageal reflux disease)   . Hypertension   . Pneumonitis   . Pneumonitis   . Renal disorder     Past Surgical History:  Procedure Laterality Date  . CATARACT EXTRACTION    . HERNIA REPAIR    . HIP ARTHROPLASTY Right    with subsequent revision  . HIP ARTHROPLASTY Left   . LUMBAR LAMINECTOMY      Family History  Problem Relation Age of Onset  . Lung cancer Father   . Diabetes Father   . Arthritis/Rheumatoid Father   . Lung cancer Brother    Social History:  reports that he has quit smoking. He has never used smokeless tobacco. He reports that he does not drink alcohol or use drugs.  Allergies  Allergen Reactions  . Ace Inhibitors Nausea And Vomiting  . Codeine Other (See Comments) and Nausea Only    Reaction:  Unknown  . Morphine Nausea And Vomiting  . Ms Contin [Morphine Sulfate Er Beads] Other (See Comments)     Reaction:  Unknown    LIVING FACILITY MEDICATIONS:  acetaminophen (TYLENOL) 325 MG tablet Take 325 mg by mouth 3 (three) times daily. Historical Provider, MD Needs Review  amiodarone (PACERONE) 200 MG tablet Take 200 mg by mouth daily.  Historical Provider, MD Needs Review  amLODipine (NORVASC) 10 MG tablet Take 10 mg by mouth at bedtime. Historical Provider, MD Needs Review  busPIRone (BUSPAR) 15 MG tablet Take 15 mg by mouth 2 (two) times daily. Historical Provider, MD Needs Review  docusate sodium (COLACE) 100 MG capsule Take 100 mg by mouth 2 (two) times daily. Historical Provider, MD Needs Review  ferrous sulfate 325 (65 FE) MG tablet Take 325 mg by mouth 3 (three) times daily. Historical Provider, MD Needs Review  hydrOXYzine (ATARAX/VISTARIL) 25 MG tablet Take 25 mg by mouth at bedtime. Historical Provider, MD Needs Review  ipratropium (ATROVENT) 0.03 % nasal spray Place 2 sprays into both nostrils 2 (two) times daily. Historical Provider, MD Needs Review  LORazepam (ATIVAN) 0.5 MG tablet Take 0.5 mg by mouth 2 (two) times daily. Historical Provider, MD Needs Review  magnesium hydroxide (MILK OF MAGNESIA) 400 MG/5ML suspension Take 30 mLs by mouth daily as needed for mild constipation. Historical Provider, MD Needs Review  Melatonin 5 MG TABS Take 5 mg by mouth at bedtime. Historical Provider, MD Needs Review  Multiple Vitamin (MULTIVITAMIN WITH MINERALS) TABS tablet Take 1 tablet by mouth daily. Historical Provider, MD Needs Review  Multiple Vitamins-Minerals (PRESERVISION AREDS PO) Take 1 capsule by mouth 2 (two) times daily. Historical Provider, MD Needs Review  Olopatadine HCl 0.2 % SOLN Place 1 drop into both eyes daily. Historical Provider, MD Needs Review  pantoprazole (PROTONIX) 40 MG tablet Take 40 mg by mouth every morning. Historical Provider, MD Needs Review   sertraline (ZOLOFT) 100 MG tablet Take 150 mg by mouth daily. Historical Provider, MD Needs Review  sodium chloride (OCEAN) 0.65 % SOLN nasal spray Place 2 sprays into both nostrils 3 (three) times daily. Historical Provider, MD Needs Review  traMADol (ULTRAM) 50 MG tablet Take 1 tablet (50 mg total) by mouth every 12 (twelve) hours. Ramonita Lab, MD Needs Review  cephALEXin (KEFLEX) 500 MG capsule Take 1 capsule (500 mg total) by mouth 2 (two) times daily. Vivi Barrack, DPM Needs Review   Patient not taking: Reported on 10/27/2016    HYDROcodone-acetaminophen (NORCO/VICODIN) 5-325 MG per tablet Take 0.5 tablets by mouth every 8 (eight) hours as needed for moderate pain. Ramonita Lab, MD Needs Review   Patient not taking: Reported on 10/27/2016      ROS:                                                                                                                                       Unable to obtain due to severe dementia. No evidence for pain.   Blood pressure 154/88, pulse 110, temperature 97.9 F (36.6 C), temperature source Axillary, resp. rate 20, SpO2 97 %.  General Examination:                                                                                                      HEENT-  Posterior head laceration. No trauma to face.   Lungs- Respirations unlabored Extremities- Warm and well-perfused  Neurological Examination Mental Status: Awake. Most speech output is dysarthric and nonsensical. Follows about 20% of simple motor commands.  Cranial Nerves: II: Will fixate on examiner. PERRL. Reacts to visual stimuli in left and right visual fields. III,IV, VI: Ptosis not present. Tracks to left and right. No nystagmus. V,VII: Smile symmetric, reacts to facial light touch stimuli bilaterally VIII: Reacts to auditory stimuli IX,X: Hypophonic speech XI: No asymmetry noted XII: midline tongue extension Motor/Sensory: Moves all 4 extremities equally to command and noxious stimuli  with >/= 4/5 strength. Deep Tendon Reflexes: 2-3+ and symmetric throughout Plantars: Equivocal bilaterally Cerebellar: No gross ataxia noted. Movements are mildly bradykinetic.  Gait: Deferred due to falls risk concerns   Lab Results: Basic Metabolic Panel:  Recent Labs Lab 10/26/16 2137  NA 137  K 3.6  CL 102  CO2 28  GLUCOSE 112*  BUN 23*  CREATININE 1.48*  CALCIUM 8.8*    Liver Function Tests:  Recent Labs Lab 10/26/16 2137  AST 17  ALT 9*  ALKPHOS 83  BILITOT 0.5  PROT 6.8  ALBUMIN 3.8   No results for input(s): LIPASE, AMYLASE in the last 168 hours. No results for input(s): AMMONIA in the last 168 hours.  CBC:  Recent Labs Lab 10/26/16 2137  WBC 15.9*  NEUTROABS 13.7*  HGB 11.6*  HCT 34.7*  MCV 88.5  PLT 284    Cardiac Enzymes: No results for input(s): CKTOTAL, CKMB, CKMBINDEX, TROPONINI in the last 168 hours.  Lipid Panel: No results for input(s): CHOL, TRIG, HDL, CHOLHDL, VLDL, LDLCALC in the last 168 hours.  CBG: No results for input(s): GLUCAP in the last 168 hours.  Microbiology: Results for orders placed or performed during the hospital encounter of 01/02/15  Culture, blood (routine x 2)     Status: None   Collection Time: 01/02/15  8:46 PM  Result Value Ref Range Status   Specimen Description BLOOD  Final   Special Requests NONE  Final   Culture NO GROWTH 5 DAYS  Final   Report Status 01/07/2015 FINAL  Final  MRSA PCR Screening     Status: Abnormal   Collection Time: 01/02/15 11:33 PM  Result Value Ref Range Status   MRSA by PCR NEGATIVE (A) NEGATIVE Final    Comment:        The GeneXpert MRSA Assay (FDA approved for NASAL specimens only), is one component of a comprehensive MRSA colonization surveillance program. It is not intended to diagnose MRSA infection nor to guide or monitor treatment for MRSA infections.   Urine culture     Status: None   Collection Time: 01/05/15 12:08 AM  Result Value Ref Range Status    Specimen Description URINE, CLEAN CATCH  Final   Special Requests Zosyn, Vanc  Final   Culture  2,000 COLONIES/mL GRAM NEGATIVE RODS  Final   Report Status 01/06/2015 FINAL  Final    Coagulation Studies:  Recent Labs  10/26/16 2137  LABPROT 13.5  INR 1.03    Imaging: Ct Head Wo Contrast  Result Date: 10/26/2016 CLINICAL DATA:  83 year old male with unwitnessed fall and head and neck injury. Initial encounter. EXAM: CT HEAD WITHOUT CONTRAST CT CERVICAL SPINE WITHOUT CONTRAST TECHNIQUE: Multidetector CT imaging of the head and cervical spine was performed following the standard protocol without intravenous contrast. Multiplanar CT image reconstructions of the cervical spine were also generated. COMPARISON:  10/07/2016 and prior CTs FINDINGS: CT HEAD FINDINGS Brain: Small right anterior parafalcine subdural hematomas noted, measuring 3 mm in greatest diameter. There is no evidence of acute infarction, midline shift, hydrocephalus, mass lesion or other areas of hemorrhage. Atrophy and chronic small-vessel white matter ischemic changes are again noted. Vascular: Intracranial atherosclerotic calcifications noted. Skull: Normal. Negative for fracture or focal lesion. Sinuses/Orbits: No acute finding. Other: Posterior scalp soft tissue swelling identified. CT CERVICAL SPINE FINDINGS Alignment: No acute subluxation. 2 mm anterolisthesis of C4 on C5 is unchanged. Skull base and vertebrae: No acute fracture or focal bony lesion. Soft tissues and spinal canal: No prevertebral fluid or swelling. No visible canal hematoma. Disc levels: Moderate degenerative disc disease and spondylosis at C5-C6 noted. Mild multilevel degenerative disc disease and mild to moderate facet arthropathy within the remainder the cervical spine noted. Upper chest: Negative. Other: None IMPRESSION: Small right anterior parafalcine subdural hematomas, measuring 3 mm in greatest diameter. No midline shift or mass effect. Posterior scalp  soft tissue swelling without fracture. No static evidence of acute injury to the cervical spine. Degenerative changes as described. Cerebral atrophy and chronic small-vessel white matter ischemic changes. Critical Value/emergent results were called by telephone at the time of interpretation on 10/26/2016 at 9:01 pm to Dr. Lacretia Nicks , who verbally acknowledged these results. Electronically Signed   By: Harmon Pier M.D.   On: 10/26/2016 20:02   Ct Cervical Spine Wo Contrast  Result Date: 10/26/2016 CLINICAL DATA:  83 year old male with unwitnessed fall and head and neck injury. Initial encounter. EXAM: CT HEAD WITHOUT CONTRAST CT CERVICAL SPINE WITHOUT CONTRAST TECHNIQUE: Multidetector CT imaging of the head and cervical spine was performed following the standard protocol without intravenous contrast. Multiplanar CT image reconstructions of the cervical spine were also generated. COMPARISON:  10/07/2016 and prior CTs FINDINGS: CT HEAD FINDINGS Brain: Small right anterior parafalcine subdural hematomas noted, measuring 3 mm in greatest diameter. There is no evidence of acute infarction, midline shift, hydrocephalus, mass lesion or other areas of hemorrhage. Atrophy and chronic small-vessel white matter ischemic changes are again noted. Vascular: Intracranial atherosclerotic calcifications noted. Skull: Normal. Negative for fracture or focal lesion. Sinuses/Orbits: No acute finding. Other: Posterior scalp soft tissue swelling identified. CT CERVICAL SPINE FINDINGS Alignment: No acute subluxation. 2 mm anterolisthesis of C4 on C5 is unchanged. Skull base and vertebrae: No acute fracture or focal bony lesion. Soft tissues and spinal canal: No prevertebral fluid or swelling. No visible canal hematoma. Disc levels: Moderate degenerative disc disease and spondylosis at C5-C6 noted. Mild multilevel degenerative disc disease and mild to moderate facet arthropathy within the remainder the cervical spine noted. Upper  chest: Negative. Other: None IMPRESSION: Small right anterior parafalcine subdural hematomas, measuring 3 mm in greatest diameter. No midline shift or mass effect. Posterior scalp soft tissue swelling without fracture. No static evidence of acute injury to the cervical spine. Degenerative changes as  described. Cerebral atrophy and chronic small-vessel white matter ischemic changes. Critical Value/emergent results were called by telephone at the time of interpretation on 10/26/2016 at 9:01 pm to Dr. Lacretia NicksBRIAN QUIGLEY , who verbally acknowledged these results. Electronically Signed   By: Harmon PierJeffrey  Hu M.D.   On: 10/26/2016 20:02    Assessment: 1. Acute parafalcine subdural hematomas x 3 following fall. The hematomas are small, the largest measuring 3.3 mm in thickness. 2. Posterior scalp hematoma.  3. Dementia. Cerebral atrophy and chronic small vessel ischemic changes are also seen on CT head.  4. No cervical spine injury per CT neck report.  5. Leukocytosis.  Recommendations: 1. Repeat CT in 6 hours following initial CT. 2. Defer to Neurosurgery and/or ED attending regarding admission versus discharge to SNF with close follow up by his outpatient physician.   Electronically signed: Dr. Caryl PinaEric Retha Bither 10/27/2016, 12:27 AM

## 2016-10-27 NOTE — Discharge Instructions (Signed)
Your subdural hematoma is very small. Because you are not on any blood thinners, you do not need any further evaluation.

## 2016-10-27 NOTE — ED Notes (Signed)
[facility] DNR given to them

## 2017-02-16 DEATH — deceased

## 2017-08-31 IMAGING — CT CT HEAD W/O CM
4 of 5 series · 16 of 47 positions shown, 18 images · non-contrast
Comparison: 10/07/2016 and prior CTs

CLINICAL DATA: [AGE] male with unwitnessed fall and head and
neck injury. Initial encounter.

EXAM:
CT HEAD WITHOUT CONTRAST
CT CERVICAL SPINE WITHOUT CONTRAST
TECHNIQUE: Multidetector CT imaging of the head and cervical spine was
performed following the standard protocol without intravenous
contrast. Multiplanar CT image reconstructions of the cervical spine
were also generated.

[Series 2: head wo · axial · 0.41mm/px · z∈[-220,-120]mm · 6 of 28 slices shown, 8 images]
[im 4/28  brain]
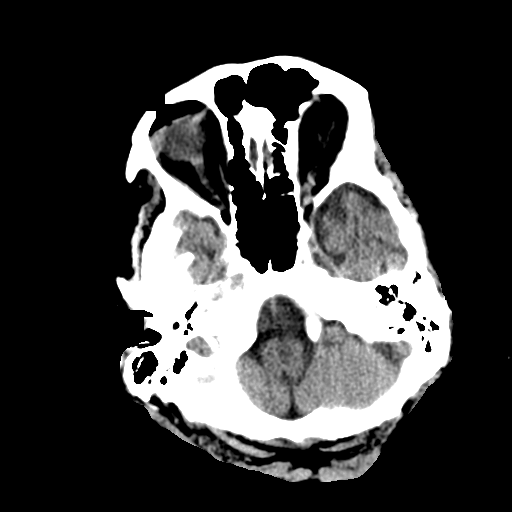
[im 4/28  bone]
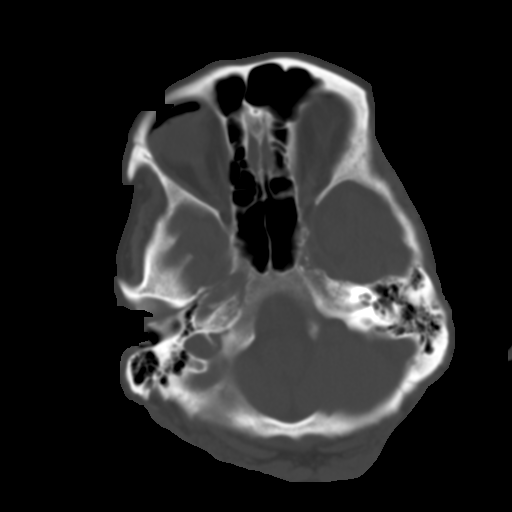
[im 8/28  brain]
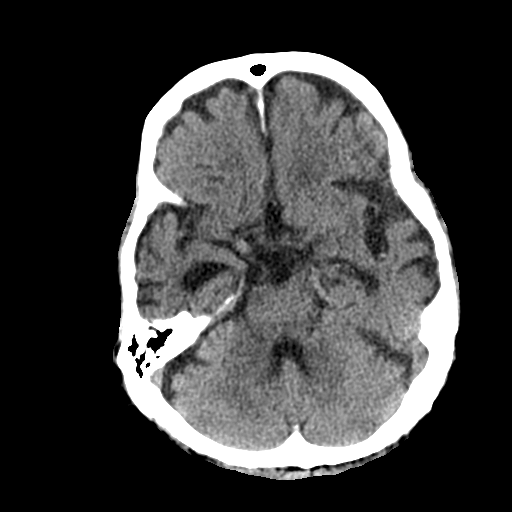
[im 12/28  brain]
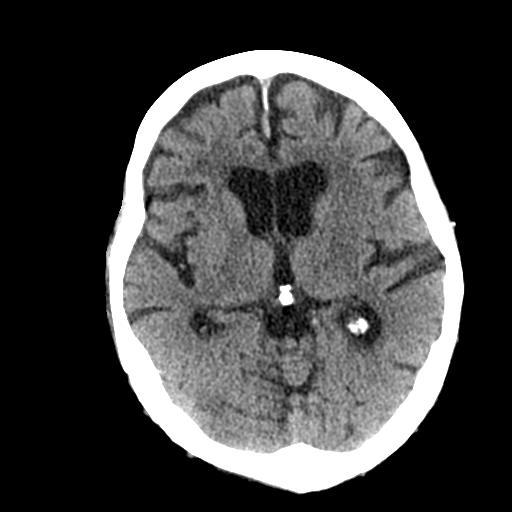
[im 16/28  brain]
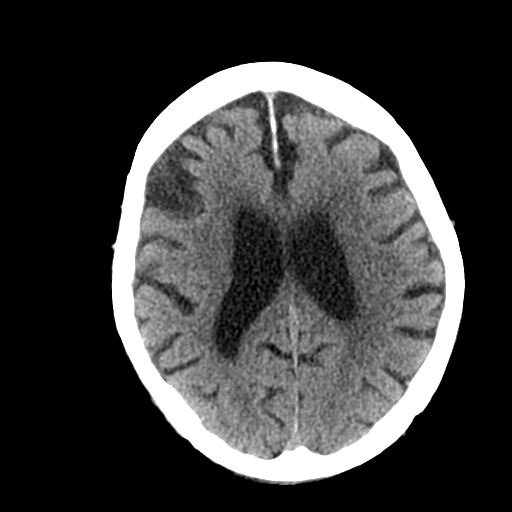
[im 20/28  brain]
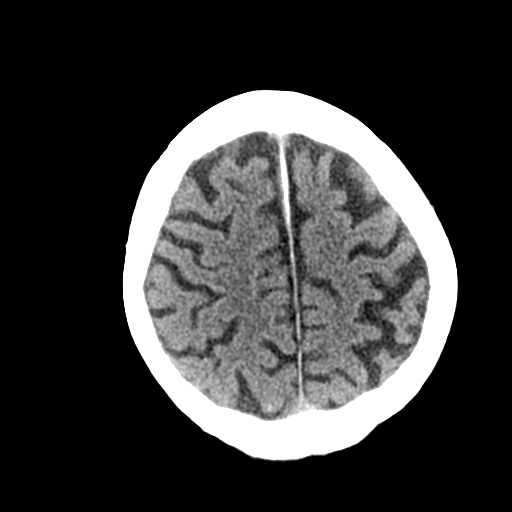
[im 20/28  bone]
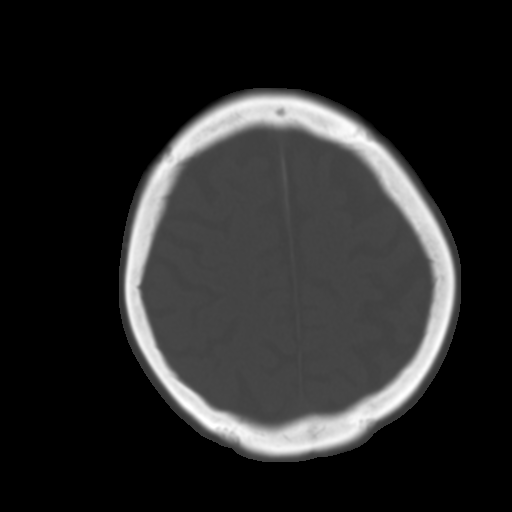
[im 24/28  brain]
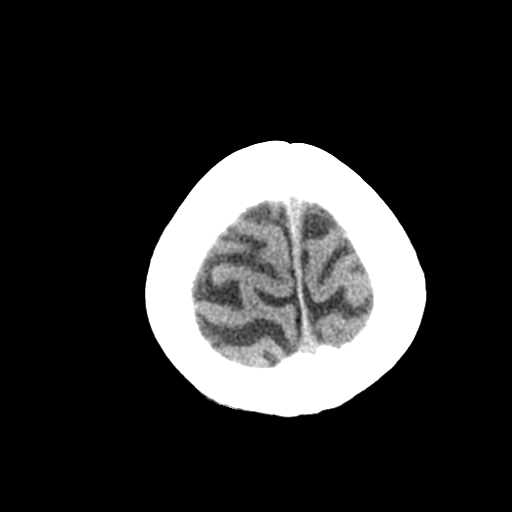

[Series 6: c spine soft · axial · 0.40mm/px · z∈[-362,-270]mm · 6 of 81 slices shown]
[im 8/81  brain]
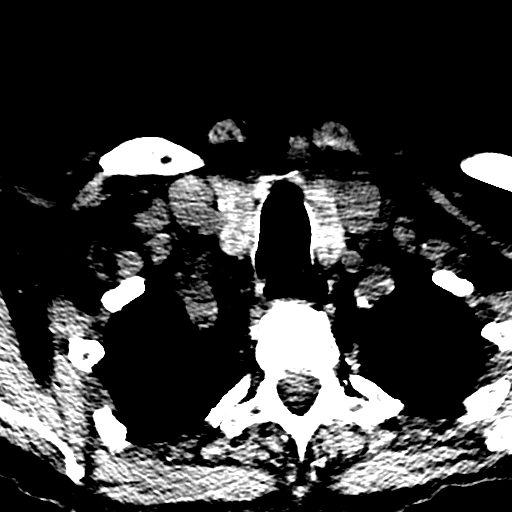
[im 16/81  brain]
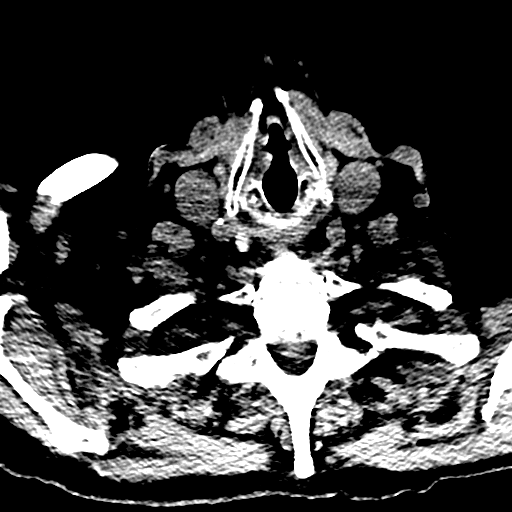
[im 27/81  brain]
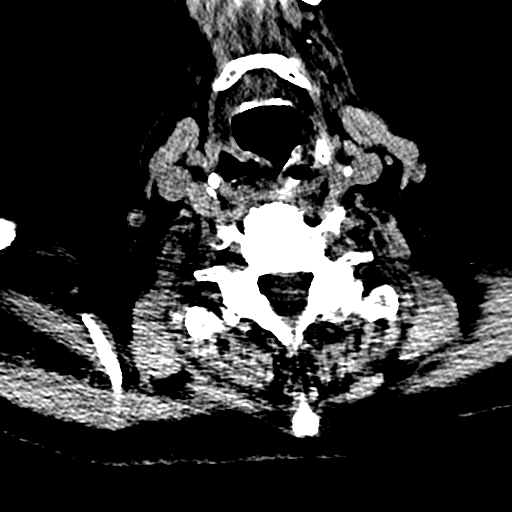
[im 35/81  brain]
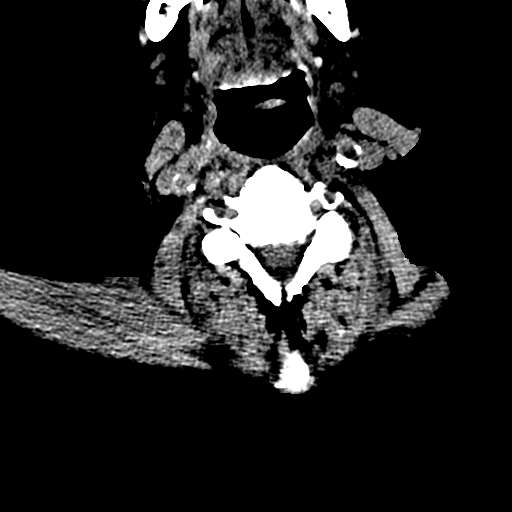
[im 46/81  brain]
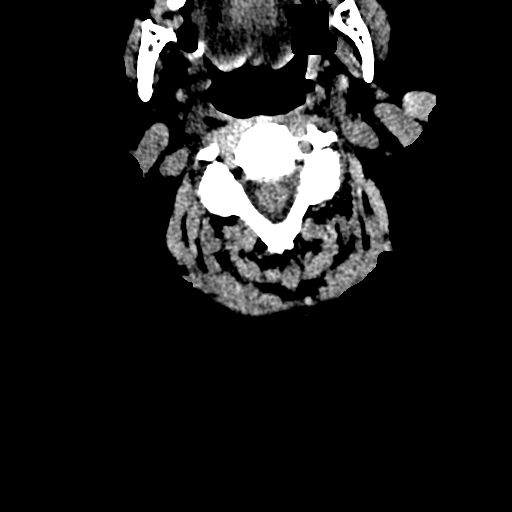
[im 54/81  brain]
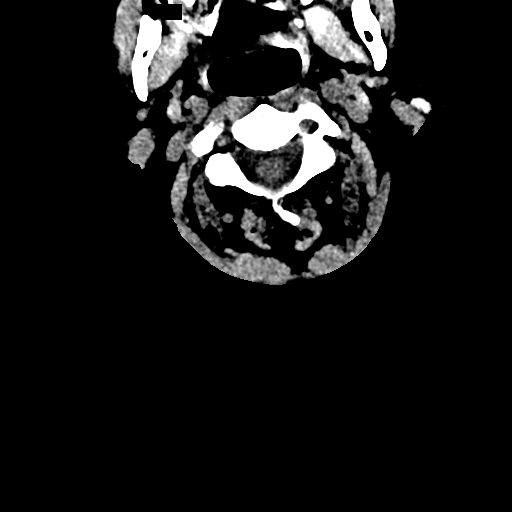

[Series 7: sagittal soft tissue · sagittal · 0.31mm/px · 1 of 51 slices shown]
[im 26/51  brain]
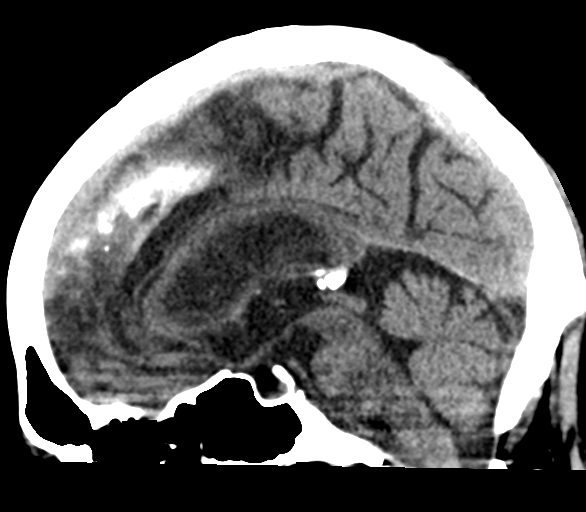

[Series 8: coronal soft tissue- · coronal · 0.30mm/px · 3 of 64 slices shown]
[im 22/64  brain]
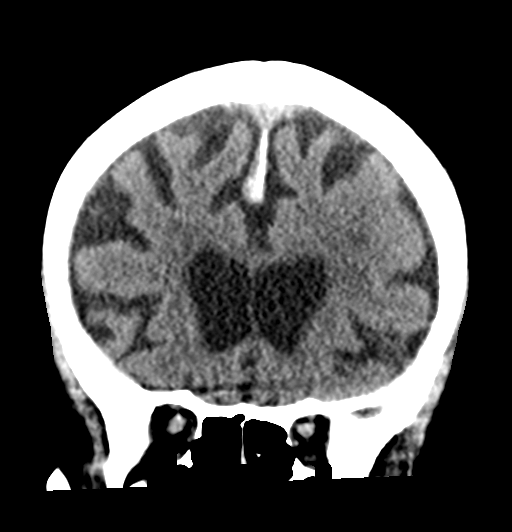
[im 29/64  brain]
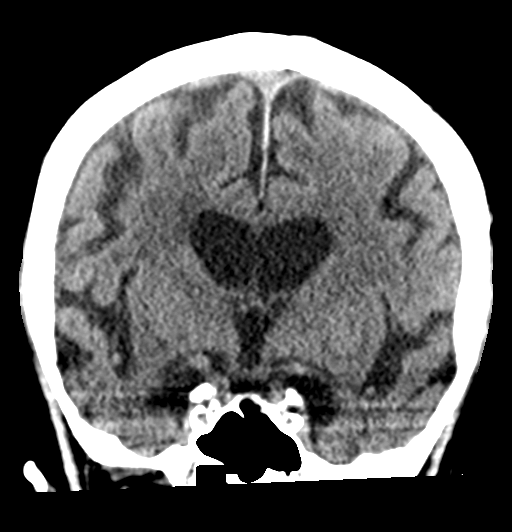
[im 36/64  brain]
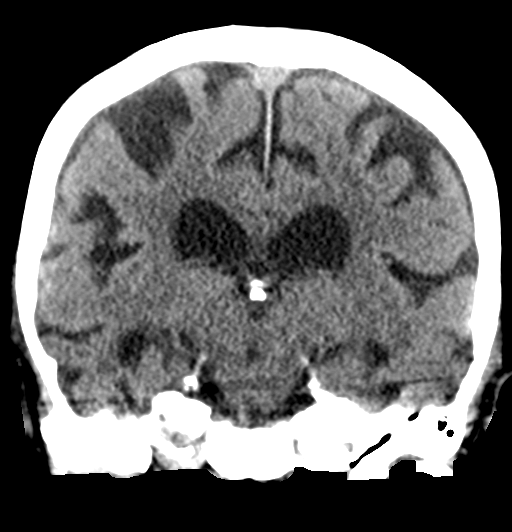

[16 of 47 positions shown; findings below may reference images not displayed]

FINDINGS: CT HEAD FINDINGS

Brain: Small right anterior parafalcine subdural hematomas noted,
measuring 3 mm in greatest diameter. There is no evidence of acute
infarction, midline shift, hydrocephalus, mass lesion or other areas
of hemorrhage. Atrophy and chronic small-vessel white matter
ischemic changes are again noted.

Vascular: Intracranial atherosclerotic calcifications noted.

Skull: Normal. Negative for fracture or focal lesion.

Sinuses/Orbits: No acute finding.

Other: Posterior scalp soft tissue swelling identified.

CT CERVICAL SPINE FINDINGS

Alignment: No acute subluxation. 2 mm anterolisthesis of C4 on C5 is
unchanged.

Skull base and vertebrae: No acute fracture or focal bony lesion.

Soft tissues and spinal canal: No prevertebral fluid or swelling. No
visible canal hematoma.

Disc levels: Moderate degenerative disc disease and spondylosis at
C5-C6 noted. Mild multilevel degenerative disc disease and mild to
moderate facet arthropathy within the remainder the cervical spine
noted.

Upper chest: Negative.

Other: None
IMPRESSION: Small right anterior parafalcine subdural hematomas, measuring 3 mm
in greatest diameter. No midline shift or mass effect.

Posterior scalp soft tissue swelling without fracture.

No static evidence of acute injury to the cervical spine.
Degenerative changes as described.

Cerebral atrophy and chronic small-vessel white matter ischemic
changes.

Critical Value/emergent results were called by telephone at the time
of interpretation on 10/26/2016 at [DATE] to Dr. IMPLANTDENTA LAZARAVICIENE , who
verbally acknowledged these results.
